# Patient Record
Sex: Female | Born: 1995 | Race: Black or African American | Hispanic: No | Marital: Single | Smoking: Former smoker
Health system: Southern US, Community
[De-identification: ages and names within clinical notes are randomized; demographics above are authoritative.]

## PROBLEM LIST (undated history)

## (undated) ENCOUNTER — Inpatient Hospital Stay (HOSPITAL_COMMUNITY): Payer: Self-pay

## (undated) DIAGNOSIS — T4145XA Adverse effect of unspecified anesthetic, initial encounter: Secondary | ICD-10-CM

## (undated) DIAGNOSIS — F319 Bipolar disorder, unspecified: Secondary | ICD-10-CM

## (undated) DIAGNOSIS — F4310 Post-traumatic stress disorder, unspecified: Secondary | ICD-10-CM

## (undated) DIAGNOSIS — T8859XA Other complications of anesthesia, initial encounter: Secondary | ICD-10-CM

## (undated) DIAGNOSIS — O1490 Unspecified pre-eclampsia, unspecified trimester: Secondary | ICD-10-CM

## (undated) DIAGNOSIS — F99 Mental disorder, not otherwise specified: Secondary | ICD-10-CM

## (undated) HISTORY — DX: Mental disorder, not otherwise specified: F99

## (undated) HISTORY — DX: Post-traumatic stress disorder, unspecified: F43.10

## (undated) HISTORY — DX: Bipolar disorder, unspecified: F31.9

## (undated) HISTORY — PX: WISDOM TOOTH EXTRACTION: SHX21

---
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
## 2017-06-25 ENCOUNTER — Encounter (HOSPITAL_COMMUNITY): Payer: Self-pay | Admitting: Emergency Medicine

## 2017-06-25 ENCOUNTER — Emergency Department (HOSPITAL_COMMUNITY)
Admission: EM | Admit: 2017-06-25 | Discharge: 2017-06-25 | Disposition: A | Discharge status: Home / Self Care | Payer: Medicaid Other | Attending: Emergency Medicine | Admitting: Emergency Medicine

## 2017-06-25 ENCOUNTER — Emergency Department (HOSPITAL_COMMUNITY): Payer: Medicaid Other

## 2017-06-25 ENCOUNTER — Other Ambulatory Visit: Payer: Self-pay

## 2017-06-25 DIAGNOSIS — Y9389 Activity, other specified: Secondary | ICD-10-CM | POA: Diagnosis not present

## 2017-06-25 DIAGNOSIS — Y999 Unspecified external cause status: Secondary | ICD-10-CM | POA: Diagnosis not present

## 2017-06-25 DIAGNOSIS — S20219A Contusion of unspecified front wall of thorax, initial encounter: Secondary | ICD-10-CM | POA: Diagnosis not present

## 2017-06-25 DIAGNOSIS — Z87891 Personal history of nicotine dependence: Secondary | ICD-10-CM | POA: Diagnosis not present

## 2017-06-25 DIAGNOSIS — Y929 Unspecified place or not applicable: Secondary | ICD-10-CM | POA: Insufficient documentation

## 2017-06-25 MED ORDER — CYCLOBENZAPRINE HCL 5 MG PO TABS: 5.0000 mg | ORAL_TABLET | Freq: Two times a day (BID) | ORAL | 0 refills | Status: DC | PRN

## 2017-06-25 MED ORDER — NAPROXEN 375 MG PO TABS: 375.0000 mg | ORAL_TABLET | Freq: Two times a day (BID) | ORAL | 0 refills | Status: DC
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
## 2017-06-25 NOTE — Discharge Instructions (Signed)
The muscle relaxer can make you sleepy so do not drive when taking it.
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
## 2017-10-19 ENCOUNTER — Encounter (HOSPITAL_COMMUNITY): Payer: Self-pay | Admitting: Emergency Medicine

## 2017-10-19 ENCOUNTER — Emergency Department (HOSPITAL_COMMUNITY)
Admission: EM | Admit: 2017-10-19 | Discharge: 2017-10-19 | Disposition: A | Discharge status: Home / Self Care | Payer: Medicaid Other | Attending: Emergency Medicine | Admitting: Emergency Medicine

## 2017-10-19 ENCOUNTER — Other Ambulatory Visit: Payer: Self-pay

## 2017-10-19 DIAGNOSIS — R112 Nausea with vomiting, unspecified: Secondary | ICD-10-CM | POA: Insufficient documentation

## 2017-10-19 DIAGNOSIS — Z3202 Encounter for pregnancy test, result negative: Secondary | ICD-10-CM | POA: Diagnosis not present

## 2017-10-19 DIAGNOSIS — Z87891 Personal history of nicotine dependence: Secondary | ICD-10-CM | POA: Diagnosis not present

## 2017-10-19 DIAGNOSIS — R1030 Lower abdominal pain, unspecified: Secondary | ICD-10-CM | POA: Diagnosis not present

## 2017-10-19 LAB — URINALYSIS, ROUTINE W REFLEX MICROSCOPIC
BILIRUBIN URINE: NEGATIVE
GLUCOSE, UA: NEGATIVE mg/dL
HGB URINE DIPSTICK: NEGATIVE
KETONES UR: 20 mg/dL — AB
Leukocytes, UA: NEGATIVE
Nitrite: NEGATIVE
Protein, ur: NEGATIVE mg/dL
Specific Gravity, Urine: 1.034 — ABNORMAL HIGH (ref 1.005–1.030)
pH: 5 (ref 5.0–8.0)

## 2017-10-19 LAB — I-STAT BETA HCG BLOOD, ED (MC, WL, AP ONLY): I-stat hCG, quantitative: <5 m[IU]/mL (ref <5)

## 2017-10-19 LAB — I-STAT CHEM 8, ED
BUN: 11 mg/dL (ref 6–20)
CALCIUM ION: 1.14 mmol/L — AB (ref 1.15–1.40)
CHLORIDE: 109 mmol/L (ref 101–111)
Creatinine, Ser: 0.7 mg/dL (ref 0.44–1.00)
Glucose, Bld: 98 mg/dL (ref 65–99)
HEMATOCRIT: 40 % (ref 36.0–46.0)
Hemoglobin: 13.6 g/dL (ref 12.0–15.0)
Potassium: 3.7 mmol/L (ref 3.5–5.1)
SODIUM: 140 mmol/L (ref 135–145)
TCO2: 20 mmol/L — AB (ref 22–32)

## 2017-10-19 MED ORDER — ONDANSETRON 8 MG PO TBDP: 8.0000 mg | ORAL_TABLET | Freq: Once | ORAL | Status: DC

## 2017-10-19 MED ORDER — ONDANSETRON 8 MG PO TBDP
8.0000 mg | ORAL_TABLET | Freq: Once | ORAL | Status: AC
  Administered 2017-10-19: 8 mg via ORAL
  Filled 2017-10-19: qty 1

## 2017-10-19 MED ORDER — SODIUM CHLORIDE 0.9 % IV BOLUS (SEPSIS): 1000.0000 mL | Freq: Once | INTRAVENOUS | Status: DC

## 2017-10-19 MED ORDER — ONDANSETRON HCL 4 MG/2ML IJ SOLN
4.0000 mg | Freq: Once | INTRAMUSCULAR | Status: DC
  Filled 2017-10-19: qty 2

## 2017-10-19 MED ORDER — PROMETHAZINE HCL 25 MG PO TABS: 25.0000 mg | ORAL_TABLET | Freq: Four times a day (QID) | ORAL | 0 refills | Status: DC | PRN

## 2017-10-19 NOTE — ED Provider Notes (Signed)
[facility]-EMERGENCY DEPT Provider Note   CSN: [id number] Arrival date & time: 10/19/17  0251     History   Chief Complaint Chief Complaint  Patient presents with  . Emesis    HPI Andrea Espinoza is a 22 y.o. female.  22 year old female presents to the emergency department for evaluation of vomiting.  She reports 2 episodes of emesis prior to arrival.  She has started to develop some lower abdominal cramping.  This is mild.  No medications taken prior to arrival.  No associated diarrhea.  She further denies dysuria, hematuria, fevers.  No history of abdominal surgeries.  She was around her daughter who had vomiting 2 days ago.  Denies questionable food ingestions; has had anorexia today.   The history is provided by the patient. No language interpreter was used.  Emesis      Past Medical History:  Diagnosis Date  . Medical history non-contributory     Patient Active Problem List   Diagnosis Date Noted  . Severe pre-eclampsia 05/11/2016    Past Surgical History:  Procedure Laterality Date  . NO PAST SURGERIES      OB History    Gravida Para Term Preterm AB Living   1 1   1   1    SAB TAB Ectopic Multiple Live Births         0 1       Home Medications    Prior to Admission medications   Medication Sig Start Date End Date Taking? Authorizing Provider  Burr MedicoXULANE 150-35 MCG/24HR transdermal patch Place 1 patch onto the skin once a week. 09/21/17  Yes [provider]  naproxen (NAPROSYN) 375 MG tablet Take 1 tablet (375 mg total) 2 (two) times daily by mouth. Patient not taking: Reported on 10/19/2017 06/25/17   Janne NapoleonNeese, Hope M, NP  promethazine (PHENERGAN) 25 MG tablet Take 1 tablet (25 mg total) by mouth every 6 (six) hours as needed for nausea or vomiting. 10/19/17   Antony MaduraHumes, [place], PA-C    Family History History reviewed. No pertinent family history.  Social History Social History   Tobacco Use  . Smoking status: Former Smoker    Types:  Cigarettes    Last attempt to quit: 09/11/2015    Years since quitting: 2.1  . Smokeless tobacco: Never Used  Substance Use Topics  . Alcohol use: No  . Drug use: No     Allergies   Patient has no known allergies.   Review of Systems Review of Systems  Gastrointestinal: Positive for vomiting.  Ten systems reviewed and are negative for acute change, except as noted in the HPI.    Physical Exam Updated Vital Signs BP 109/69 (BP Location: Left Arm)   Pulse 95   Temp 99 F (37.2 C) (Oral)   Resp 18   Ht 5\' 7"  (1.702 m)   Wt 74.8 kg (165 lb)   LMP 09/29/2017   SpO2 100%   BMI 25.84 kg/m   Physical Exam  Constitutional: She is oriented to person, place, and time. She appears well-developed and well-nourished. No distress.  Nontoxic appearing and in NAD  HENT:  Head: Normocephalic and atraumatic.  Eyes: Conjunctivae and EOM are normal. No scleral icterus.  Neck: Normal range of motion.  Cardiovascular: Normal rate, regular rhythm and intact distal pulses.  Pulmonary/Chest: Effort normal. No stridor. No respiratory distress. She has no wheezes.  Lungs CTAB  Abdominal: Soft. She exhibits no mass. There is tenderness (minimal, suprapubic). There is  no guarding.  Abdomen soft, nondistended  Musculoskeletal: Normal range of motion.  Neurological: She is alert and oriented to person, place, and time. She exhibits normal muscle tone. Coordination normal.  Skin: Skin is warm and dry. No rash noted. She is not diaphoretic. No erythema. No pallor.  Psychiatric: She has a normal mood and affect. Her behavior is normal.  Nursing note and vitals reviewed.    ED Treatments / Results  Labs (all labs ordered are listed, but only abnormal results are displayed) Labs Reviewed  URINALYSIS, ROUTINE W REFLEX MICROSCOPIC - Abnormal; Notable for the following components:      Result Value   Specific Gravity, Urine 1.034 (*)    Ketones, ur 20 (*)    All other components within normal  limits  I-STAT CHEM 8, ED - Abnormal; Notable for the following components:   Calcium, Ion 1.14 (*)    TCO2 20 (*)    All other components within normal limits  I-STAT BETA HCG BLOOD, ED ([facility], [facility], [facility] ONLY)    EKG  EKG Interpretation None       Radiology No results found.  Procedures Procedures (including critical care time)  Medications Ordered in ED Medications  ondansetron (ZOFRAN-ODT) disintegrating tablet 8 mg (8 mg Oral Given 10/19/17 0358)     Initial Impression / Assessment and Plan / ED Course  I have reviewed the triage vital signs and the nursing notes.  Pertinent labs & imaging results that were available during my care of the patient were reviewed by me and considered in my medical decision making (see chart for details).     22 year old female presents to the emergency department for evaluation of vomiting x2 this evening.  Mild suprapubic cramping and tenderness on exam.  Urinalysis does not suggest infection.  Chemistry panel reassuring.  Pregnancy negative.  Symptoms have been managed adequately in the emergency department with Zofran.  The patient is able to tolerate oral fluids without additional vomiting.  Suspect viral illness as patient was around her daughter who had vomiting 2 days ago.  No signs of acute surgical abdomen on exam.  No TTP at McBurney's point.  I do not believe emergent imaging is currently indicated.  Return precautions discussed and provided. Patient discharged in stable condition with no unaddressed concerns.   Final Clinical Impressions(s) / ED Diagnoses   Final diagnoses:  Non-intractable vomiting with nausea, unspecified vomiting type    ED Discharge Orders        Ordered    promethazine (PHENERGAN) 25 MG tablet  Every 6 hours PRN     10/19/17 0534       Antony Madura, PA-C 10/19/17 0541    Gerhard Munch, MD 10/19/17 [phone]

## 2017-10-19 NOTE — ED Triage Notes (Signed)
Pt reports having 2 episodes of emesis since last night.

## 2017-10-19 NOTE — ED Notes (Signed)
Pt. Tolerated PO fluid intake ,denied N/V.

## 2018-06-24 LAB — CHG URINE PREGNANCY TEST: URINE CULTURE, OB: POSITIVE

## 2018-07-08 ENCOUNTER — Encounter: Payer: Self-pay | Admitting: *Deleted

## 2018-08-03 ENCOUNTER — Other Ambulatory Visit (HOSPITAL_COMMUNITY): Payer: Self-pay | Admitting: Nurse Practitioner

## 2018-08-03 DIAGNOSIS — Z369 Encounter for antenatal screening, unspecified: Secondary | ICD-10-CM

## 2018-08-03 LAB — OB RESULTS CONSOLE GC/CHLAMYDIA
Chlamydia: NEGATIVE
Gonorrhea: NEGATIVE

## 2018-08-03 LAB — OB RESULTS CONSOLE HGB/HCT, BLOOD
HCT: 35 (ref 29–41)
Hemoglobin: 11.5

## 2018-08-03 LAB — OB RESULTS CONSOLE ANTIBODY SCREEN: ANTIBODY SCREEN: NEGATIVE

## 2018-08-03 LAB — OB RESULTS CONSOLE HEPATITIS B SURFACE ANTIGEN: Hepatitis B Surface Ag: NEGATIVE

## 2018-08-03 LAB — CYSTIC FIBROSIS DIAGNOSTIC STUDY: INTERPRETATION-CFDNA: NEGATIVE

## 2018-08-03 LAB — OB RESULTS CONSOLE ABO/RH: RH TYPE: POSITIVE

## 2018-08-03 LAB — OB RESULTS CONSOLE VARICELLA ZOSTER ANTIBODY, IGG: Varicella: NON-IMMUNE/NOT IMMUNE

## 2018-08-03 LAB — OB RESULTS CONSOLE HIV ANTIBODY (ROUTINE TESTING): HIV: NONREACTIVE

## 2018-08-03 LAB — OB RESULTS CONSOLE RPR: RPR: NONREACTIVE

## 2018-08-03 LAB — OB RESULTS CONSOLE PLATELET COUNT: Platelets: 251

## 2018-08-03 LAB — CULTURE, OB URINE: Urine Culture, OB: NEGATIVE

## 2018-08-04 ENCOUNTER — Other Ambulatory Visit: Payer: Self-pay | Admitting: Nurse Practitioner

## 2018-08-04 DIAGNOSIS — N6310 Unspecified lump in the right breast, unspecified quadrant: Secondary | ICD-10-CM

## 2018-08-17 ENCOUNTER — Encounter: Payer: Medicaid Other | Admitting: Obstetrics and Gynecology

## 2018-08-18 ENCOUNTER — Encounter: Payer: Self-pay | Admitting: General Practice

## 2018-08-18 ENCOUNTER — Telehealth: Payer: Self-pay | Admitting: Family Medicine

## 2018-08-18 ENCOUNTER — Encounter (HOSPITAL_COMMUNITY): Payer: Self-pay

## 2018-08-18 DIAGNOSIS — O09299 Supervision of pregnancy with other poor reproductive or obstetric history, unspecified trimester: Secondary | ICD-10-CM | POA: Insufficient documentation

## 2018-08-18 DIAGNOSIS — Z8759 Personal history of other complications of pregnancy, childbirth and the puerperium: Secondary | ICD-10-CM | POA: Insufficient documentation

## 2018-08-18 DIAGNOSIS — O0990 Supervision of high risk pregnancy, unspecified, unspecified trimester: Secondary | ICD-10-CM | POA: Insufficient documentation

## 2018-08-18 NOTE — Telephone Encounter (Signed)
Call attempt made, left a voicemail informing the patient to call our clinic. Sending a reminder letter with the updated information.

## 2018-08-19 NOTE — L&D Delivery Note (Addendum)
Delivery Note At 10:36 AM a viable female was delivered via Vaginal on hands and kness, Spontaneous (Presentation: Vertex; OA).  APGAR: 9, 9; weight 7 lb 8.8 oz (3425 g).   Placenta status: Spontaneous, Intact.  Cord: 3 vessels with the following complications: none.   Anesthesia:  None Episiotomy: None Lacerations: 1st degree;Perineal Suture Repair: vicryl rapide 3-0 Est. Blood Loss (mL): 168cc per Triton  Mom to postpartum.  Baby to Couplet care / Skin to Skin.  Gerlene Fee, DO Family Medicine Resident, PGY-1  Attestation: I agree with the resident's documentation. I was present for the entire delivery and assisted with the repair. Some elevated BP just prior and immediately postpartum. CBC and CMP collected and wnl. BP improved to normal range prior to transfer to postpartum unit.   Lambert Mody. Juleen China, DO OB/GYN Fellow

## 2018-08-20 ENCOUNTER — Encounter: Payer: Self-pay | Admitting: *Deleted

## 2018-08-21 ENCOUNTER — Other Ambulatory Visit: Payer: Medicaid Other

## 2018-08-25 ENCOUNTER — Encounter (HOSPITAL_COMMUNITY): Payer: Self-pay

## 2018-08-25 ENCOUNTER — Ambulatory Visit (HOSPITAL_COMMUNITY)
Admission: RE | Admit: 2018-08-25 | Discharge: 2018-08-25 | Disposition: A | Discharge status: Home / Self Care | Payer: Medicaid Other | Source: Ambulatory Visit | Attending: Nurse Practitioner | Admitting: Nurse Practitioner

## 2018-08-25 ENCOUNTER — Other Ambulatory Visit (HOSPITAL_COMMUNITY): Payer: Self-pay | Admitting: Nurse Practitioner

## 2018-08-25 ENCOUNTER — Ambulatory Visit (HOSPITAL_COMMUNITY): Admission: RE | Admit: 2018-08-25 | Payer: Medicaid Other | Source: Ambulatory Visit

## 2018-08-25 DIAGNOSIS — Z369 Encounter for antenatal screening, unspecified: Secondary | ICD-10-CM | POA: Diagnosis present

## 2018-08-25 DIAGNOSIS — Z8759 Personal history of other complications of pregnancy, childbirth and the puerperium: Secondary | ICD-10-CM | POA: Diagnosis present

## 2018-08-25 DIAGNOSIS — O0990 Supervision of high risk pregnancy, unspecified, unspecified trimester: Secondary | ICD-10-CM | POA: Insufficient documentation

## 2018-08-25 DIAGNOSIS — O09299 Supervision of pregnancy with other poor reproductive or obstetric history, unspecified trimester: Secondary | ICD-10-CM

## 2018-08-25 DIAGNOSIS — Z3687 Encounter for antenatal screening for uncertain dates: Secondary | ICD-10-CM

## 2018-08-25 DIAGNOSIS — Z3682 Encounter for antenatal screening for nuchal translucency: Secondary | ICD-10-CM | POA: Insufficient documentation

## 2018-08-25 DIAGNOSIS — Z3A13 13 weeks gestation of pregnancy: Secondary | ICD-10-CM

## 2018-08-25 DIAGNOSIS — O09213 Supervision of pregnancy with history of pre-term labor, third trimester: Secondary | ICD-10-CM

## 2018-08-25 HISTORY — DX: Adverse effect of unspecified anesthetic, initial encounter: T41.45XA

## 2018-08-25 HISTORY — DX: Other complications of anesthesia, initial encounter: T88.59XA

## 2018-08-25 HISTORY — DX: Unspecified pre-eclampsia, unspecified trimester: O14.90

## 2018-08-26 ENCOUNTER — Ambulatory Visit (HOSPITAL_COMMUNITY)
Admission: RE | Admit: 2018-08-26 | Discharge: 2018-08-26 | Disposition: A | Discharge status: Home / Self Care | Payer: Medicaid Other | Source: Ambulatory Visit | Attending: Nurse Practitioner | Admitting: Nurse Practitioner

## 2018-08-26 ENCOUNTER — Ambulatory Visit (HOSPITAL_COMMUNITY): Payer: Self-pay | Admitting: Nurse Practitioner

## 2018-08-26 ENCOUNTER — Other Ambulatory Visit (HOSPITAL_COMMUNITY): Payer: Self-pay | Admitting: Nurse Practitioner

## 2018-08-26 DIAGNOSIS — Z3682 Encounter for antenatal screening for nuchal translucency: Secondary | ICD-10-CM | POA: Diagnosis not present

## 2018-08-26 NOTE — ED Notes (Signed)
Patient in session with Genetic Counselor, Lauren Taylor. 

## 2018-08-26 NOTE — ED Notes (Signed)
Alpha Thal, Inheritest Core, Mat 21 drawn today.

## 2018-08-27 ENCOUNTER — Encounter: Payer: Self-pay | Admitting: Obstetrics and Gynecology

## 2018-08-27 ENCOUNTER — Ambulatory Visit (INDEPENDENT_AMBULATORY_CARE_PROVIDER_SITE_OTHER): Payer: Self-pay | Admitting: Obstetrics and Gynecology

## 2018-08-27 VITALS — BP 105/70 | HR 73 | Wt 176.6 lb

## 2018-08-27 DIAGNOSIS — O0992 Supervision of high risk pregnancy, unspecified, second trimester: Secondary | ICD-10-CM

## 2018-08-27 DIAGNOSIS — O0990 Supervision of high risk pregnancy, unspecified, unspecified trimester: Secondary | ICD-10-CM

## 2018-08-27 DIAGNOSIS — Z8759 Personal history of other complications of pregnancy, childbirth and the puerperium: Secondary | ICD-10-CM

## 2018-08-27 MED ORDER — ASPIRIN EC 81 MG PO TBEC: 81.0000 mg | DELAYED_RELEASE_TABLET | Freq: Every day | ORAL | 2 refills | Status: DC

## 2018-08-27 NOTE — Patient Instructions (Addendum)
Second Trimester of Pregnancy The second trimester is from week 14 through week 27 (months 4 through 6). The second trimester is often a time when you feel your best. Your body has adjusted to being pregnant, and you begin to feel better physically. Usually, morning sickness has lessened or quit completely, you may have more energy, and you may have an increase in appetite. The second trimester is also a time when the fetus is growing rapidly. At the end of the sixth month, the fetus is about 9 inches long and weighs about 1 pounds. You will likely begin to feel the baby move (quickening) between 16 and 20 weeks of pregnancy. Body changes during your second trimester Your body continues to go through many changes during your second trimester. The changes vary from woman to woman.  Your weight will continue to increase. You will notice your lower abdomen bulging out.  You may begin to get stretch marks on your hips, abdomen, and breasts.  You may develop headaches that can be relieved by medicines. The medicines should be approved by your health care provider.  You may urinate more often because the fetus is pressing on your bladder.  You may develop or continue to have heartburn as a result of your pregnancy.  You may develop constipation because certain hormones are causing the muscles that push waste through your intestines to slow down.  You may develop hemorrhoids or swollen, bulging veins (varicose veins).  You may have back pain. This is caused by: ? Weight gain. ? Pregnancy hormones that are relaxing the joints in your pelvis. ? A shift in weight and the muscles that support your balance.  Your breasts will continue to grow and they will continue to become tender.  Your gums may bleed and may be sensitive to brushing and flossing.  Dark spots or blotches (chloasma, mask of pregnancy) may develop on your face. This will likely fade after the baby is born.  A dark line from  your belly button to the pubic area (linea nigra) may appear. This will likely fade after the baby is born.  You may have changes in your hair. These can include thickening of your hair, rapid growth, and changes in texture. Some women also have hair loss during or after pregnancy, or hair that feels dry or thin. Your hair will most likely return to normal after your baby is born. What to expect at prenatal visits During a routine prenatal visit:  You will be weighed to make sure you and the fetus are growing normally.  Your blood pressure will be taken.  Your abdomen will be measured to track your baby's growth.  The fetal heartbeat will be listened to.  Any test results from the previous visit will be discussed. Your health care provider may ask you:  How you are feeling.  If you are feeling the baby move.  If you have had any abnormal symptoms, such as leaking fluid, bleeding, severe headaches, or abdominal cramping.  If you are using any tobacco products, including cigarettes, chewing tobacco, and electronic cigarettes.  If you have any questions. Other tests that may be performed during your second trimester include:  Blood tests that check for: ? Low iron levels (anemia). ? High blood sugar that affects pregnant women (gestational diabetes) between 47 and 28 weeks. ? Rh antibodies. This is to check for a protein on red blood cells (Rh factor).  Urine tests to check for infections, diabetes, or protein in  the urine.  An ultrasound to confirm the proper growth and development of the baby.  An amniocentesis to check for possible genetic problems.  Fetal screens for spina bifida and Down syndrome.  HIV (human immunodeficiency virus) testing. Routine prenatal testing includes screening for HIV, unless you choose not to have this test. Follow these instructions at home: Medicines  Follow your health care provider's instructions regarding medicine use. Specific medicines  may be either safe or unsafe to take during pregnancy.  Take a prenatal vitamin that contains at least 600 micrograms (mcg) of folic acid.  If you develop constipation, try taking a stool softener if your health care provider approves. Eating and drinking   Eat a balanced diet that includes fresh fruits and vegetables, whole grains, good sources of protein such as meat, eggs, or tofu, and low-fat dairy. Your health care provider will help you determine the amount of weight gain that is right for you.  Avoid raw meat and uncooked cheese. These carry germs that can cause birth defects in the baby.  If you have low calcium intake from food, talk to your health care provider about whether you should take a daily calcium supplement.  Limit foods that are high in fat and processed sugars, such as fried and sweet foods.  To prevent constipation: ? Drink enough fluid to keep your urine clear or pale yellow. ? Eat foods that are high in fiber, such as fresh fruits and vegetables, whole grains, and beans. Activity  Exercise only as directed by your health care provider. Most women can continue their usual exercise routine during pregnancy. Try to exercise for 30 minutes at least 5 days a week. Stop exercising if you experience uterine contractions.  Avoid heavy lifting, wear low heel shoes, and practice good posture.  A sexual relationship may be continued unless your health care provider directs you otherwise. Relieving pain and discomfort  Wear a good support bra to prevent discomfort from breast tenderness.  Take warm sitz baths to soothe any pain or discomfort caused by hemorrhoids. Use hemorrhoid cream if your health care provider approves.  Rest with your legs elevated if you have leg cramps or low back pain.  If you develop varicose veins, wear support hose. Elevate your feet for 15 minutes, 3-4 times a day. Limit salt in your diet. Prenatal Care  Write down your questions. Take  them to your prenatal visits.  Keep all your prenatal visits as told by your health care provider. This is important. Safety  Wear your seat belt at all times when driving.  Make a list of emergency phone numbers, including numbers for family, friends, the hospital, and police and fire departments. General instructions  Ask your health care provider for a referral to a local prenatal education class. Begin classes no later than the beginning of month 6 of your pregnancy.  Ask for help if you have counseling or nutritional needs during pregnancy. Your health care provider can offer advice or refer you to specialists for help with various needs.  Do not use hot tubs, steam rooms, or saunas.  Do not douche or use tampons or scented sanitary pads.  Do not cross your legs for long periods of time.  Avoid cat litter boxes and soil used by cats. These carry germs that can cause birth defects in the baby and possibly loss of the fetus by miscarriage or stillbirth.  Avoid all smoking, herbs, alcohol, and unprescribed drugs. Chemicals in these products can affect the  formation and growth of the baby.  Do not use any products that contain nicotine or tobacco, such as cigarettes and e-cigarettes. If you need help quitting, ask your health care provider.  Visit your dentist if you have not gone yet during your pregnancy. Use a soft toothbrush to brush your teeth and be gentle when you floss. Contact a health care provider if:  You have dizziness.  You have mild pelvic cramps, pelvic pressure, or nagging pain in the abdominal area.  You have persistent nausea, vomiting, or diarrhea.  You have a bad smelling vaginal discharge.  You have pain when you urinate. Get help right away if:  You have a fever.  You are leaking fluid from your vagina.  You have spotting or bleeding from your vagina.  You have severe abdominal cramping or pain.  You have rapid weight gain or weight loss.  You  have shortness of breath with chest pain.  You notice sudden or extreme swelling of your face, hands, ankles, feet, or legs.  You have not felt your baby move in over an hour.  You have severe headaches that do not go away when you take medicine.  You have vision changes. Summary  The second trimester is from week 14 through week 27 (months 4 through 6). It is also a time when the fetus is growing rapidly.  Your body goes through many changes during pregnancy. The changes vary from woman to woman.  Avoid all smoking, herbs, alcohol, and unprescribed drugs. These chemicals affect the formation and growth your baby.  Do not use any tobacco products, such as cigarettes, chewing tobacco, and e-cigarettes. If you need help quitting, ask your health care provider.  Contact your health care provider if you have any questions. Keep all prenatal visits as told by your health care provider. This is important. This information is not intended to replace advice given to you by your health care provider. Make sure you discuss any questions you have with your health care provider. Document Released: 07/30/2001 Document Revised: 09/10/2016 Document Reviewed: 09/10/2016 Elsevier Interactive Patient Education  2019 [employer].   Contraception Choices Contraception, also called birth control, refers to methods or devices that prevent pregnancy. Hormonal methods Contraceptive implant  A contraceptive implant is a thin, plastic tube that contains a hormone. It is inserted into the upper part of the arm. It can remain in place for up to 3 years. Progestin-only injections Progestin-only injections are injections of progestin, a synthetic form of the hormone progesterone. They are given every 3 months by a health care provider. Birth control pills  Birth control pills are pills that contain hormones that prevent pregnancy. They must be taken once a day, preferably at the same time each day. Birth  control patch  The birth control patch contains hormones that prevent pregnancy. It is placed on the skin and must be changed once a week for three weeks and removed on the fourth week. A prescription is needed to use this method of contraception. Vaginal ring  A vaginal ring contains hormones that prevent pregnancy. It is placed in the vagina for three weeks and removed on the fourth week. After that, the process is repeated with a new ring. A prescription is needed to use this method of contraception. Emergency contraceptive Emergency contraceptives prevent pregnancy after unprotected sex. They come in pill form and can be taken up to 5 days after sex. They work best the sooner they are taken after having sex. Most  emergency contraceptives are available without a prescription. This method should not be used as your only form of birth control. Barrier methods Female condom  A female condom is a thin sheath that is worn over the penis during sex. Condoms keep sperm from going inside a woman's body. They can be used with a spermicide to increase their effectiveness. They should be disposed after a single use. Female condom  A female condom is a soft, loose-fitting sheath that is put into the vagina before sex. The condom keeps sperm from going inside a woman's body. They should be disposed after a single use. Diaphragm  A diaphragm is a soft, dome-shaped barrier. It is inserted into the vagina before sex, along with a spermicide. The diaphragm blocks sperm from entering the uterus, and the spermicide kills sperm. A diaphragm should be left in the vagina for 6-8 hours after sex and removed within 24 hours. A diaphragm is prescribed and fitted by a health care provider. A diaphragm should be replaced every 1-2 years, after giving birth, after gaining more than 15 lb (6.8 kg), and after pelvic surgery. Cervical cap  A cervical cap is a round, soft latex or plastic cup that fits over the cervix. It is  inserted into the vagina before sex, along with spermicide. It blocks sperm from entering the uterus. The cap should be left in place for 6-8 hours after sex and removed within 48 hours. A cervical cap must be prescribed and fitted by a health care provider. It should be replaced every 2 years. Sponge  A sponge is a soft, circular piece of polyurethane foam with spermicide on it. The sponge helps block sperm from entering the uterus, and the spermicide kills sperm. To use it, you make it wet and then insert it into the vagina. It should be inserted before sex, left in for at least 6 hours after sex, and removed and thrown away within 30 hours. Spermicides Spermicides are chemicals that kill or block sperm from entering the cervix and uterus. They can come as a cream, jelly, suppository, foam, or tablet. A spermicide should be inserted into the vagina with an applicator at least 93-26 minutes before sex to allow time for it to work. The process must be repeated every time you have sex. Spermicides do not require a prescription. Intrauterine contraception Intrauterine device (IUD) An IUD is a T-shaped device that is put in a woman's uterus. There are two types:  Hormone IUD.This type contains progestin, a synthetic form of the hormone progesterone. This type can stay in place for 3-5 years.  Copper IUD.This type is wrapped in copper wire. It can stay in place for 10 years.  Permanent methods of contraception Female tubal ligation In this method, a woman's fallopian tubes are sealed, tied, or blocked during surgery to prevent eggs from traveling to the uterus. Hysteroscopic sterilization In this method, a small, flexible insert is placed into each fallopian tube. The inserts cause scar tissue to form in the fallopian tubes and block them, so sperm cannot reach an egg. The procedure takes about 3 months to be effective. Another form of birth control must be used during those 3 months. Female  sterilization This is a procedure to tie off the tubes that carry sperm (vasectomy). After the procedure, the man can still ejaculate fluid (semen). Natural planning methods Natural family planning In this method, a couple does not have sex on days when the woman could become pregnant. Calendar method This means keeping  track of the length of each menstrual cycle, identifying the days when pregnancy can happen, and not having sex on those days. Ovulation method In this method, a couple avoids sex during ovulation. Symptothermal method This method involves not having sex during ovulation. The woman typically checks for ovulation by watching changes in her temperature and in the consistency of cervical mucus. Post-ovulation method In this method, a couple waits to have sex until after ovulation. Summary  Contraception, also called birth control, means methods or devices that prevent pregnancy.  Hormonal methods of contraception include implants, injections, pills, patches, vaginal rings, and emergency contraceptives.  Barrier methods of contraception can include female condoms, female condoms, diaphragms, cervical caps, sponges, and spermicides.  There are two types of IUDs (intrauterine devices). An IUD can be put in a woman's uterus to prevent pregnancy for 3-5 years.  Permanent sterilization can be done through a procedure for males, females, or both.  Natural family planning methods involve not having sex on days when the woman could become pregnant. This information is not intended to replace advice given to you by your health care provider. Make sure you discuss any questions you have with your health care provider. Document Released: 08/05/2005 Document Revised: 08/07/2017 Document Reviewed: 09/07/2016 Elsevier Interactive Patient Education  2019 [employer].   Breastfeeding  Choosing to breastfeed is one of the best decisions you can make for yourself and your baby. A change in  hormones during pregnancy causes your breasts to make breast milk in your milk-producing glands. Hormones prevent breast milk from being released before your baby is born. They also prompt milk flow after birth. Once breastfeeding has begun, thoughts of your baby, as well as his or her sucking or crying, can stimulate the release of milk from your milk-producing glands. Benefits of breastfeeding Research shows that breastfeeding offers many health benefits for infants and mothers. It also offers a cost-free and convenient way to feed your baby. For your baby  Your first milk (colostrum) helps your baby's digestive system to function better.  Special cells in your milk (antibodies) help your baby to fight off infections.  Breastfed babies are less likely to develop asthma, allergies, obesity, or type 2 diabetes. They are also at lower risk for sudden infant death syndrome (SIDS).  Nutrients in breast milk are better able to meet your baby's needs compared to infant formula.  Breast milk improves your baby's brain development. For you  Breastfeeding helps to create a very special bond between you and your baby.  Breastfeeding is convenient. Breast milk costs nothing and is always available at the correct temperature.  Breastfeeding helps to burn calories. It helps you to lose the weight that you gained during pregnancy.  Breastfeeding makes your uterus return faster to its size before pregnancy. It also slows bleeding (lochia) after you give birth.  Breastfeeding helps to lower your risk of developing type 2 diabetes, osteoporosis, rheumatoid arthritis, cardiovascular disease, and breast, ovarian, uterine, and endometrial cancer later in life. Breastfeeding basics Starting breastfeeding  Find a comfortable place to sit or lie down, with your neck and back well-supported.  Place a pillow or a rolled-up blanket under your baby to bring him or her to the level of your breast (if you are  seated). Nursing pillows are specially designed to help support your arms and your baby while you breastfeed.  Make sure that your baby's tummy (abdomen) is facing your abdomen.  Gently massage your breast. With your fingertips, massage from  the outer edges of your breast inward toward the nipple. This encourages milk flow. If your milk flows slowly, you may need to continue this action during the feeding.  Support your breast with 4 fingers underneath and your thumb above your nipple (make the letter "C" with your hand). Make sure your fingers are well away from your nipple and your baby's mouth.  Stroke your baby's lips gently with your finger or nipple.  When your baby's mouth is open wide enough, quickly bring your baby to your breast, placing your entire nipple and as much of the areola as possible into your baby's mouth. The areola is the colored area around your nipple. ? More areola should be visible above your baby's upper lip than below the lower lip. ? Your baby's lips should be opened and extended outward (flanged) to ensure an adequate, comfortable latch. ? Your baby's tongue should be between his or her lower gum and your breast.  Make sure that your baby's mouth is correctly positioned around your nipple (latched). Your baby's lips should create a seal on your breast and be turned out (everted).  It is common for your baby to suck about 2-3 minutes in order to start the flow of breast milk. Latching Teaching your baby how to latch onto your breast properly is very important. An improper latch can cause nipple pain, decreased milk supply, and poor weight gain in your baby. Also, if your baby is not latched onto your nipple properly, he or she may swallow some air during feeding. This can make your baby fussy. Burping your baby when you switch breasts during the feeding can help to get rid of the air. However, teaching your baby to latch on properly is still the best way to prevent  fussiness from swallowing air while breastfeeding. Signs that your baby has successfully latched onto your nipple  Silent tugging or silent sucking, without causing you pain. Infant's lips should be extended outward (flanged).  Swallowing heard between every 3-4 sucks once your milk has started to flow (after your let-down milk reflex occurs).  Muscle movement above and in front of his or her ears while sucking. Signs that your baby has not successfully latched onto your nipple  Sucking sounds or smacking sounds from your baby while breastfeeding.  Nipple pain. If you think your baby has not latched on correctly, slip your finger into the corner of your baby's mouth to break the suction and place it between your baby's gums. Attempt to start breastfeeding again. Signs of successful breastfeeding Signs from your baby  Your baby will gradually decrease the number of sucks or will completely stop sucking.  Your baby will fall asleep.  Your baby's body will relax.  Your baby will retain a small amount of milk in his or her mouth.  Your baby will let go of your breast by himself or herself. Signs from you  Breasts that have increased in firmness, weight, and size 1-3 hours after feeding.  Breasts that are softer immediately after breastfeeding.  Increased milk volume, as well as a change in milk consistency and color by the fifth day of breastfeeding.  Nipples that are not sore, cracked, or bleeding. Signs that your baby is getting enough milk  Wetting at least 1-2 diapers during the first 24 hours after birth.  Wetting at least 5-6 diapers every 24 hours for the first week after birth. The urine should be clear or pale yellow by the age of 5 days.  Wetting 6-8 diapers every 24 hours as your baby continues to grow and develop.  At least 3 stools in a 24-hour period by the age of 5 days. The stool should be soft and yellow.  At least 3 stools in a 24-hour period by the age of 7  days. The stool should be seedy and yellow.  No loss of weight greater than 10% of birth weight during the first 3 days of life.  Average weight gain of 4-7 oz (113-198 g) per week after the age of 4 days.  Consistent daily weight gain by the age of 5 days, without weight loss after the age of 2 weeks. After a feeding, your baby may spit up a small amount of milk. This is normal. Breastfeeding frequency and duration Frequent feeding will help you make more milk and can prevent sore nipples and extremely full breasts (breast engorgement). Breastfeed when you feel the need to reduce the fullness of your breasts or when your baby shows signs of hunger. This is called "breastfeeding on demand." Signs that your baby is hungry include:  Increased alertness, activity, or restlessness.  Movement of the head from side to side.  Opening of the mouth when the corner of the mouth or cheek is stroked (rooting).  Increased sucking sounds, smacking lips, cooing, sighing, or squeaking.  Hand-to-mouth movements and sucking on fingers or hands.  Fussing or crying. Avoid introducing a pacifier to your baby in the first 4-6 weeks after your baby is born. After this time, you may choose to use a pacifier. Research has shown that pacifier use during the first year of a baby's life decreases the risk of sudden infant death syndrome (SIDS). Allow your baby to feed on each breast as long as he or she wants. When your baby unlatches or falls asleep while feeding from the first breast, offer the second breast. Because newborns are often sleepy in the first few weeks of life, you may need to awaken your baby to get him or her to feed. Breastfeeding times will vary from baby to baby. However, the following rules can serve as a guide to help you make sure that your baby is properly fed:  Newborns (babies 49 weeks of age or younger) may breastfeed every 1-3 hours.  Newborns should not go without breastfeeding for longer  than 3 hours during the day or 5 hours during the night.  You should breastfeed your baby a minimum of 8 times in a 24-hour period. Breast milk pumping     Pumping and storing breast milk allows you to make sure that your baby is exclusively fed your breast milk, even at times when you are unable to breastfeed. This is especially important if you go back to work while you are still breastfeeding, or if you are not able to be present during feedings. Your lactation consultant can help you find a method of pumping that works best for you and give you guidelines about how long it is safe to store breast milk. Caring for your breasts while you breastfeed Nipples can become dry, cracked, and sore while breastfeeding. The following recommendations can help keep your breasts moisturized and healthy:  Avoid using soap on your nipples.  Wear a supportive bra designed especially for nursing. Avoid wearing underwire-style bras or extremely tight bras (sports bras).  Air-dry your nipples for 3-4 minutes after each feeding.  Use only cotton bra pads to absorb leaked breast milk. Leaking of breast milk between feedings is  normal.  Use lanolin on your nipples after breastfeeding. Lanolin helps to maintain your skin's normal moisture barrier. Pure lanolin is not harmful (not toxic) to your baby. You may also hand express a few drops of breast milk and gently massage that milk into your nipples and allow the milk to air-dry. In the first few weeks after giving birth, some women experience breast engorgement. Engorgement can make your breasts feel heavy, warm, and tender to the touch. Engorgement peaks within 3-5 days after you give birth. The following recommendations can help to ease engorgement:  Completely empty your breasts while breastfeeding or pumping. You may want to start by applying warm, moist heat (in the shower or with warm, water-soaked hand towels) just before feeding or pumping. This increases  circulation and helps the milk flow. If your baby does not completely empty your breasts while breastfeeding, pump any extra milk after he or she is finished.  Apply ice packs to your breasts immediately after breastfeeding or pumping, unless this is too uncomfortable for you. To do this: ? Put ice in a plastic bag. ? Place a towel between your skin and the bag. ? Leave the ice on for 20 minutes, 2-3 times a day.  Make sure that your baby is latched on and positioned properly while breastfeeding. If engorgement persists after 48 hours of following these recommendations, contact your health care provider or a Science writer. Overall health care recommendations while breastfeeding  Eat 3 healthy meals and 3 snacks every day. Well-nourished mothers who are breastfeeding need an additional 450-500 calories a day. You can meet this requirement by increasing the amount of a balanced diet that you eat.  Drink enough water to keep your urine pale yellow or clear.  Rest often, relax, and continue to take your prenatal vitamins to prevent fatigue, stress, and low vitamin and mineral levels in your body (nutrient deficiencies).  Do not use any products that contain nicotine or tobacco, such as cigarettes and e-cigarettes. Your baby may be harmed by chemicals from cigarettes that pass into breast milk and exposure to secondhand smoke. If you need help quitting, ask your health care provider.  Avoid alcohol.  Do not use illegal drugs or marijuana.  Talk with your health care provider before taking any medicines. These include over-the-counter and prescription medicines as well as vitamins and herbal supplements. Some medicines that may be harmful to your baby can pass through breast milk.  It is possible to become pregnant while breastfeeding. If birth control is desired, ask your health care provider about options that will be safe while breastfeeding your baby. Where to find more  information: [employer]: www.llli.org Contact a health care provider if:  You feel like you want to stop breastfeeding or have become frustrated with breastfeeding.  Your nipples are cracked or bleeding.  Your breasts are red, tender, or warm.  You have: ? Painful breasts or nipples. ? A swollen area on either breast. ? A fever or chills. ? Nausea or vomiting. ? Drainage other than breast milk from your nipples.  Your breasts do not become full before feedings by the fifth day after you give birth.  You feel sad and depressed.  Your baby is: ? Too sleepy to eat well. ? Having trouble sleeping. ? More than 16 week old and wetting fewer than 6 diapers in a 24-hour period. ? Not gaining weight by 87 days of age.  Your baby has fewer than 3 stools in a  24-hour period.  Your baby's skin or the white parts of his or her eyes become yellow. Get help right away if:  Your baby is overly tired (lethargic) and does not want to wake up and feed.  Your baby develops an unexplained fever. Summary  Breastfeeding offers many health benefits for infant and mothers.  Try to breastfeed your infant when he or she shows early signs of hunger.  Gently tickle or stroke your baby's lips with your finger or nipple to allow the baby to open his or her mouth. Bring the baby to your breast. Make sure that much of the areola is in your baby's mouth. Offer one side and burp the baby before you offer the other side.  Talk with your health care provider or lactation consultant if you have questions or you face problems as you breastfeed. This information is not intended to replace advice given to you by your health care provider. Make sure you discuss any questions you have with your health care provider. Document Released: 08/05/2005 Document Revised: 09/06/2016 Document Reviewed: 09/06/2016 Elsevier Interactive Patient Education  2019 [employer] Education  Options: [facility] Department Classes:  Childbirth education classes can help you get ready for a positive parenting experience. You can also meet other expectant parents and get free stuff for your baby. Each class runs for five weeks on the same night and costs $45 for the mother-to-be and her support person. Medicaid covers the cost if you are eligible. Call [phone] to register. [facility] Childbirth Education:  [phone] or [phone] or [email or web address]  Baby & Me Class: Discuss newborn & infant parenting and family adjustment issues with other new mothers in a relaxed environment. Each week brings a new speaker or baby-centered activity. We encourage new mothers to join [place] every Thursday at 11:00am. Babies birth until crawling. No registration or fee. Daddy [employer]: This course offers Dads-to-be the tools and knowledge needed to feel confident on their journey to becoming new fathers. Experienced dads, who have been trained as coaches, teach dads-to-be how to hold, comfort, diaper, swaddle and play with their infant while being able to support the new mom as well. A class for men taught by men. $25/dad Big Brother/Big Sister: Let your children share in the joy of a new brother or sister in this special class designed just for them. Class includes discussion about how families care for babies: swaddling, holding, diapering, safety as well as how they can be helpful in their new role. This class is designed for children ages 72 to 19, but any age is welcome. Please register each child individually. $5/child  Mom Talk: This mom-led group offers support and connection to mothers as they journey through the adjustments and struggles of that sometimes overwhelming first year after the birth of a child. Tuesdays at 10:00am and Thursdays at 6:00pm. Babies welcome. No registration or fee. Breastfeeding Support Group: This group is a mother-to-mother support circle where  moms have the opportunity to share their breastfeeding experiences. A Lactation Consultant is present for questions and concerns. Meets each Tuesday at 11:00am. No fee or registration. Breastfeeding Your Baby: Learn what to expect in the first days of breastfeeding your newborn.  This class will help you feel more confident with the skills needed to begin your breastfeeding experience. Many new mothers are concerned about breastfeeding after leaving the hospital. This class will also address the most common fears and challenges about breastfeeding during the first few weeks, months  and beyond. (call for fee) Comfort Techniques and Tour: This 2 hour interactive class will provide you the opportunity to learn & practice hands-on techniques that can help relieve some of the discomfort of labor and encourage your baby to rotate toward the best position for birth. You and your partner will be able to try a variety of labor positions with birth balls and rebozos as well as practice breathing, relaxation, and visualization techniques. A tour of the [facility] is included with this class. $20 per registrant and support person Childbirth Class- Weekend Option: This class is a Weekend version of our Birth & Baby series. It is designed for parents who have a difficult time fitting several weeks of classes into their schedule. It covers the care of your newborn and the basics of labor and childbirth. It also includes a [place] [facility] and lunch. The class is held two consecutive days: beginning on Friday evening from 6:30 - 8:30 p.m. and the next day, Saturday from 9 a.m. - 4 p.m. (call for fee) Doren Custard Class: Interested in a waterbirth?  This informational class will help you discover whether waterbirth is the right fit for you. Education about waterbirth itself, supplies you would need and how to assemble your support team is what you can expect from this  class. Some obstetrical practices require this class in order to pursue a waterbirth. (Not all obstetrical practices offer waterbirth-check with your healthcare provider.) Register only the expectant mom, but you are encouraged to bring your partner to class! Required if planning waterbirth, no fee. Infant/Child CPR: Parents, grandparents, babysitters, and friends learn Cardio-Pulmonary Resuscitation skills for infants and children. You will also learn how to treat both conscious and unconscious choking in infants and children. This Family & Friends program does not offer certification. Register each participant individually to ensure that enough mannequins are available. (Call for fee) Grandparent Love: Expecting a grandbaby? This class is for you! Learn about the latest infant care and safety recommendations and ways to support your own child as he or she transitions into the parenting role. Taught by Registered Nurses who are childbirth instructors, but most importantly...they are grandmothers too! $10/person. Childbirth Class- Natural Childbirth: This series of 5 weekly classes is for expectant parents who want to learn and practice natural methods of coping with the process of labor and childbirth. Relaxation, breathing, massage, visualization, role of the partner, and helpful positioning are highlighted. Participants learn how to be confident in their body's ability to give birth. This class will empower and help parents make informed decisions about their own care. Includes discussion that will help new parents transition into the immediate postpartum period. [facility] is included. We suggest taking this class between 25-32 weeks, but it's only a recommendation. $75 per registrant and one support person or $30 Medicaid. Childbirth Class- 3 week Series: This option of 3 weekly classes helps you and your labor partner prepare for childbirth. Newborn care, labor & birth,  cesarean birth, pain management, and comfort techniques are discussed and a [place] [facility] is included. The class meets at the same time, on the same day of the week for 3 consecutive weeks beginning with the starting date you choose. $60 for registrant and one support person.  Marvelous Multiples: Expecting twins, triplets, or more? This class covers the differences in labor, birth, parenting, and breastfeeding issues that face multiples' parents. NICU tour is  included. Led by a Certified Childbirth Educator who is the mother of twins. No fee. Caring for Baby: This class is for expectant and adoptive parents who want to learn and practice the most up-to-date newborn care for their babies. Focus is on birth through the first six weeks of life. Topics include feeding, bathing, diapering, crying, umbilical cord care, circumcision care and safe sleep. Parents learn to recognize symptoms of illness and when to call the pediatrician. Register only the mom-to-be and your partner or support person can plan to come with you! $10 per registrant and support person Childbirth Class- online option: This online class offers you the freedom to complete a Birth and Baby series in the comfort of your own home. The flexibility of this option allows you to review sections at your own pace, at times convenient to you and your support people. It includes additional video information, animations, quizzes, and extended activities. Get organized with helpful eClass tools, checklists, and trackers. Once you register online for the class, you will receive an email within a few days to accept the invitation and begin the class when the time is right for you. The content will be available to you for 60 days. $60 for 60 days of online access for you and your support people.  Local Doulas: Natural Baby Doulas [email or web address] Tel: [phone] [email or web address] [employer] [phone] [email or web address] www.[email or web address] The Labor Hassell Halim  (also do waterbirth tub rental) [phone] [email or web address] [facility] Birth Doula [phone] [email or web address] NotebookDistributors.fi Sacred Rhythms  [phone] https://[email or web address]-[email or web address]/ [employer] (PADA) [email or web address] https://[email or web address]/ [place] Birth and Baby  http://[email or web address]/ Considering Waterbirth? Guide for patients at [facility] [employer]  Why consider waterbirth?  . Gentle birth for babies . Less pain medicine used in labor . May allow for passive descent/less pushing . May reduce perineal tears  . More mobility and instinctive maternal position changes . Increased maternal relaxation . Reduced blood pressure in labor  Is waterbirth safe? What are the risks of infection, drowning or other complications?  . Infection: o Very low risk (3.7 % for tub vs 4.8% for bed) o 7 in 8000 waterbirths with documented infection o Poorly cleaned equipment most common cause o Slightly lower group B strep transmission rate  . Drowning o Maternal:  - Very low risk   - Related to seizures or fainting o Newborn:  - Very low risk. No evidence of increased risk of respiratory problems in multiple large studies - Physiological protection from breathing under water - Avoid underwater birth if there are any fetal complications - Once baby's head is out of the water, keep it out.  . Birth complication o Some reports of cord trauma, but risk decreased by bringing baby to surface gradually o No evidence of increased risk of shoulder dystocia. Mothers can usually change positions faster in water than in a bed, possibly aiding the maneuvers to free the shoulder.   You must attend a Doren Custard class at [facility]  3rd Wednesday of every month from  7-9pm  [employer] by calling [phone] or online at [email or web address]  Bring [place] the certificate from the class to your prenatal appointment  Meet with a midwife at 36 weeks to see if you can still plan a waterbirth and to sign the consent.   Purchase or rent the following supplies:   Water Birth Pool (Birth Pool in a Box or [place] for instance)  (Tubs start ~$125)  Single-use disposable tub liner designed for your brand of tub  New garden hose labeled "lead-free", "suitable for drinking water",  Electric drain pump to remove water (We recommend 792 gallon per hour or greater pump.)   Separate garden hose to remove the dirty water  Fish net  Bathing suit top (optional)  Long-handled mirror (optional)  Places to purchase or rent supplies  GotWebTools.is for tub purchases and supplies  Waterbirthsolutions.com for tub purchases and supplies  The Labor Ladies (www.[email or web address]) $275 for tub rental/set-up & take down/kit   [employer] (http://www.fleming.com/.htm) Information regarding doulas (labor support) who provide pool rentals  Our practice has a Birth Pool in a Box tub at the hospital that you may borrow on a first-come-first-served basis. It is your responsibility to to set up, clean and break down the tub. We cannot guarantee the availability of this tub in advance. You are responsible for bringing all accessories listed above. If you do not have all necessary supplies you cannot have a waterbirth.    Things that would prevent you from having a waterbirth:  Premature, <37wks  Previous cesarean birth  Presence of thick meconium-stained fluid  Multiple gestation (Twins, triplets, etc.)  Uncontrolled diabetes or gestational diabetes requiring medication  Hypertension requiring medication or diagnosis of pre-eclampsia  Heavy vaginal bleeding  Non-reassuring fetal heart rate  Active infection (MRSA, etc.). Group  B Strep is NOT a contraindication for  waterbirth.  If your labor has to be induced and induction method requires continuous  monitoring of the baby's heart rate  Other risks/issues identified by your obstetrical provider  Please remember that birth is unpredictable. Under certain unforeseeable circumstances your provider may advise against giving birth in the tub. These decisions will be made on a case-by-case basis and with the safety of you and your baby as our highest priority.

## 2018-08-27 NOTE — Progress Notes (Addendum)
Anatomy ultrasound scheduled for Tuesday Feb 11 @ 0815.  Pt verbalized understanding.

## 2018-08-27 NOTE — Progress Notes (Signed)
  Subjective:    Andrea Espinoza is a [id number] [redacted]w[redacted]d being seen today for her first obstetrical visit at [facility]. Patient is transferring care from [facility] department.  Her obstetrical history is significant for history of severe preeclampsia, HELLP syndrome resulting in preterm delivery. Patient does intend to breast feed. Pregnancy history fully reviewed.  Patient reports no complaints.  Vitals:   08/27/18 1403  BP: 105/70  Pulse: 73  Weight: 176 lb 9.6 oz (80.1 kg)    HISTORY: OB History  Gravida Para Term Preterm AB Living  3 1 0 1 1 1   SAB TAB Ectopic Multiple Live Births  0 1 0 0 1    # Outcome Date GA Lbr Len/2nd Weight Sex Delivery Anes PTL Lv  3 Current           2 Preterm 05/15/16 [redacted]w[redacted]d 03:03 / 00:17 3 lb 8.8 oz (1.61 kg) F Vag-Spont None  LIV     Birth Comments: IOL for severe Pre-E     Complications: Severe pre-eclampsia, HELLP syndrome  1 TAB            Past Medical History:  Diagnosis Date  . Bipolar affective disorder (HCC)   . Complication of anesthesia   . Mental disorder    bipolar, PTSD  . Preeclampsia   . PTSD (post-traumatic stress disorder)    Past Surgical History:  Procedure Laterality Date  . WISDOM TOOTH EXTRACTION     Family History  Problem Relation Age of Onset  . Diabetes Maternal Grandmother      Exam        Assessment:    Pregnancy: [id number] Patient Active Problem List   Diagnosis Date Noted  . Supervision of high risk pregnancy, antepartum 08/18/2018  . History of pre-eclampsia 08/18/2018  . History of HELLP syndrome, currently pregnant 08/18/2018        Plan:     Initial labs drawn. Prenatal vitamins. Problem list reviewed and updated. Genetic Screening discussed :  Done with MFM.  Ultrasound discussed; fetal survey: ordered. Rx ASA provided Baseline labs ordered  Follow up in 4 weeks. 50% of 30 min visit spent on counseling and coordination of care.     Demarea Lorey 08/27/2018

## 2018-08-28 LAB — COMPREHENSIVE METABOLIC PANEL
ALK PHOS: 35 IU/L — AB (ref 39–117)
ALT: 9 IU/L (ref 0–32)
AST: 11 IU/L (ref 0–40)
Albumin/Globulin Ratio: 1.8 (ref 1.2–2.2)
Albumin: 4.1 g/dL (ref 3.5–5.5)
BUN/Creatinine Ratio: 14 (ref 9–23)
BUN: 9 mg/dL (ref 6–20)
Bilirubin Total: 0.5 mg/dL (ref 0.0–1.2)
CO2: 19 mmol/L — ABNORMAL LOW (ref 20–29)
Calcium: 9.1 mg/dL (ref 8.7–10.2)
Chloride: 104 mmol/L (ref 96–106)
Creatinine, Ser: 0.63 mg/dL (ref 0.57–1.00)
GFR calc Af Amer: 149 mL/min/{1.73_m2} (ref >59)
GFR calc non Af Amer: 130 mL/min/{1.73_m2} (ref >59)
Globulin, Total: 2.3 g/dL (ref 1.5–4.5)
Glucose: 75 mg/dL (ref 65–99)
POTASSIUM: 4.5 mmol/L (ref 3.5–5.2)
Sodium: 137 mmol/L (ref 134–144)
Total Protein: 6.4 g/dL (ref 6.0–8.5)

## 2018-08-28 LAB — PROTEIN / CREATININE RATIO, URINE
Creatinine, Urine: 94.9 mg/dL
Protein, Ur: 6 mg/dL
Protein/Creat Ratio: 63 mg/g creat (ref 0–200)

## 2018-08-28 LAB — CBC
Hematocrit: 32.7 % — ABNORMAL LOW (ref 34.0–46.6)
Hemoglobin: 11.1 g/dL (ref 11.1–15.9)
MCH: 26.4 pg — ABNORMAL LOW (ref 26.6–33.0)
MCHC: 33.9 g/dL (ref 31.5–35.7)
MCV: 78 fL — ABNORMAL LOW (ref 79–97)
PLATELETS: 278 10*3/uL (ref 150–450)
RBC: 4.21 x10E6/uL (ref 3.77–5.28)
RDW: 13.7 % (ref 11.7–15.4)
WBC: 9 10*3/uL (ref 3.4–10.8)

## 2018-08-31 ENCOUNTER — Other Ambulatory Visit (HOSPITAL_COMMUNITY): Payer: Self-pay | Admitting: Nurse Practitioner

## 2018-09-04 ENCOUNTER — Other Ambulatory Visit (HOSPITAL_COMMUNITY): Payer: Self-pay | Admitting: Nurse Practitioner

## 2018-09-08 ENCOUNTER — Encounter: Payer: Medicaid Other | Admitting: Medical

## 2018-09-21 ENCOUNTER — Encounter (HOSPITAL_COMMUNITY): Payer: Self-pay

## 2018-09-24 ENCOUNTER — Encounter: Payer: Self-pay | Admitting: Obstetrics and Gynecology

## 2018-09-29 ENCOUNTER — Other Ambulatory Visit: Payer: Self-pay

## 2018-09-29 ENCOUNTER — Ambulatory Visit (INDEPENDENT_AMBULATORY_CARE_PROVIDER_SITE_OTHER): Payer: Self-pay | Admitting: Obstetrics and Gynecology

## 2018-09-29 ENCOUNTER — Telehealth: Payer: Self-pay | Admitting: Obstetrics and Gynecology

## 2018-09-29 ENCOUNTER — Encounter: Payer: Self-pay | Admitting: Obstetrics and Gynecology

## 2018-09-29 ENCOUNTER — Ambulatory Visit (HOSPITAL_COMMUNITY): Payer: Self-pay

## 2018-09-29 VITALS — BP 108/61 | HR 78 | Wt 184.1 lb

## 2018-09-29 DIAGNOSIS — O09292 Supervision of pregnancy with other poor reproductive or obstetric history, second trimester: Secondary | ICD-10-CM

## 2018-09-29 DIAGNOSIS — Z3A18 18 weeks gestation of pregnancy: Secondary | ICD-10-CM

## 2018-09-29 DIAGNOSIS — O0990 Supervision of high risk pregnancy, unspecified, unspecified trimester: Secondary | ICD-10-CM

## 2018-09-29 DIAGNOSIS — O09299 Supervision of pregnancy with other poor reproductive or obstetric history, unspecified trimester: Secondary | ICD-10-CM

## 2018-09-29 DIAGNOSIS — Z8759 Personal history of other complications of pregnancy, childbirth and the puerperium: Secondary | ICD-10-CM

## 2018-09-29 DIAGNOSIS — O0992 Supervision of high risk pregnancy, unspecified, second trimester: Secondary | ICD-10-CM

## 2018-09-29 MED ORDER — DOCUSATE SODIUM 100 MG PO CAPS: 100.0000 mg | ORAL_CAPSULE | Freq: Two times a day (BID) | ORAL | 2 refills | Status: DC | PRN

## 2018-09-29 MED ORDER — ASPIRIN EC 81 MG PO TBEC: 81.0000 mg | DELAYED_RELEASE_TABLET | Freq: Every day | ORAL | 2 refills | Status: DC

## 2018-09-29 NOTE — Patient Instructions (Signed)

## 2018-09-29 NOTE — Telephone Encounter (Signed)
Attempted to call pt w/ her ultrasound appt. No answer, LVM with appt time and date. Left ultrasound number incase needing to reschedule

## 2018-09-29 NOTE — Progress Notes (Signed)
Subjective:  Andrea Espinoza is a 23 y.o. [id number] at 3956w5d being seen today for ongoing prenatal care.  She is currently monitored for the following issues for this high-risk pregnancy and has Supervision of high risk pregnancy, antepartum; History of pre-eclampsia; and History of HELLP syndrome, currently pregnant on their problem list.  Patient reports constipation.  Contractions: Not present. Vag. Bleeding: None.  Movement: Present. Denies leaking of fluid.   The following portions of the patient's history were reviewed and updated as appropriate: allergies, current medications, past family history, past medical history, past social history, past surgical history and problem list. Problem list updated.  Objective:   Vitals:   09/29/18 1125  BP: 108/61  Pulse: 78  Weight: 184 lb 1.6 oz (83.5 kg)    Fetal Status: Fetal Heart Rate (bpm): 145   Movement: Present     General:  Alert, oriented and cooperative. Patient is in no acute distress.  Skin: Skin is warm and dry. No rash noted.   Cardiovascular: Normal heart rate noted  Respiratory: Normal respiratory effort, no problems with respiration noted  Abdomen: Soft, gravid, appropriate for gestational age. Pain/Pressure: Present     Pelvic:  Cervical exam deferred        Extremities: Normal range of motion.  Edema: None  Mental Status: Normal mood and affect. Normal behavior. Normal judgment and thought content.   Urinalysis:      Assessment and Plan:  Pregnancy: [id number] at 2756w5d  1. Supervision of high risk pregnancy, antepartum Stable U/S ordered  2. History of HELLP syndrome, currently pregnant Continue with BASA  3. History of pre-eclampsia As noted above  Preterm labor symptoms and general obstetric precautions including but not limited to vaginal bleeding, contractions, leaking of fluid and fetal movement were reviewed in detail with the patient. Please refer to After Visit Summary for other counseling recommendations.   Return in about 4 weeks (around 10/27/2018) for OB visit.   Hermina StaggersErvin, Kostantinos Tallman L, MD

## 2018-10-02 ENCOUNTER — Ambulatory Visit (HOSPITAL_COMMUNITY): Payer: Self-pay

## 2018-10-09 ENCOUNTER — Ambulatory Visit (HOSPITAL_COMMUNITY): Payer: Medicaid Other

## 2018-10-26 ENCOUNTER — Other Ambulatory Visit (HOSPITAL_COMMUNITY): Payer: Self-pay | Admitting: *Deleted

## 2018-10-26 ENCOUNTER — Other Ambulatory Visit: Payer: Self-pay | Admitting: Obstetrics and Gynecology

## 2018-10-26 ENCOUNTER — Ambulatory Visit (HOSPITAL_COMMUNITY)
Admission: RE | Admit: 2018-10-26 | Discharge: 2018-10-26 | Disposition: A | Discharge status: Home / Self Care | Payer: Medicaid Other | Source: Ambulatory Visit | Attending: Obstetrics and Gynecology | Admitting: Obstetrics and Gynecology

## 2018-10-26 DIAGNOSIS — Z8759 Personal history of other complications of pregnancy, childbirth and the puerperium: Secondary | ICD-10-CM | POA: Diagnosis not present

## 2018-10-26 DIAGNOSIS — O09292 Supervision of pregnancy with other poor reproductive or obstetric history, second trimester: Secondary | ICD-10-CM

## 2018-10-26 DIAGNOSIS — O35EXX0 Maternal care for other (suspected) fetal abnormality and damage, fetal genitourinary anomalies, not applicable or unspecified: Secondary | ICD-10-CM

## 2018-10-26 DIAGNOSIS — O359XX0 Maternal care for (suspected) fetal abnormality and damage, unspecified, not applicable or unspecified: Secondary | ICD-10-CM

## 2018-10-26 DIAGNOSIS — Z3A22 22 weeks gestation of pregnancy: Secondary | ICD-10-CM

## 2018-10-26 DIAGNOSIS — Z363 Encounter for antenatal screening for malformations: Secondary | ICD-10-CM | POA: Diagnosis not present

## 2018-10-26 DIAGNOSIS — O09212 Supervision of pregnancy with history of pre-term labor, second trimester: Secondary | ICD-10-CM

## 2018-10-26 DIAGNOSIS — Z8279 Family history of other congenital malformations, deformations and chromosomal abnormalities: Secondary | ICD-10-CM

## 2018-10-26 DIAGNOSIS — O358XX0 Maternal care for other (suspected) fetal abnormality and damage, not applicable or unspecified: Secondary | ICD-10-CM

## 2018-10-27 ENCOUNTER — Other Ambulatory Visit (HOSPITAL_COMMUNITY)
Admission: RE | Admit: 2018-10-27 | Discharge: 2018-10-27 | Disposition: A | Discharge status: Home / Self Care | Payer: Medicaid Other | Source: Ambulatory Visit | Attending: Obstetrics & Gynecology | Admitting: Obstetrics & Gynecology

## 2018-10-27 ENCOUNTER — Encounter: Payer: Self-pay | Admitting: Internal Medicine

## 2018-10-27 ENCOUNTER — Ambulatory Visit (INDEPENDENT_AMBULATORY_CARE_PROVIDER_SITE_OTHER): Payer: Medicaid Other | Admitting: Obstetrics & Gynecology

## 2018-10-27 VITALS — BP 108/76 | HR 75 | Wt 194.9 lb

## 2018-10-27 DIAGNOSIS — N760 Acute vaginitis: Secondary | ICD-10-CM | POA: Insufficient documentation

## 2018-10-27 DIAGNOSIS — Z3A22 22 weeks gestation of pregnancy: Secondary | ICD-10-CM

## 2018-10-27 DIAGNOSIS — O23592 Infection of other part of genital tract in pregnancy, second trimester: Secondary | ICD-10-CM | POA: Diagnosis not present

## 2018-10-27 DIAGNOSIS — O09299 Supervision of pregnancy with other poor reproductive or obstetric history, unspecified trimester: Secondary | ICD-10-CM

## 2018-10-27 DIAGNOSIS — O0990 Supervision of high risk pregnancy, unspecified, unspecified trimester: Secondary | ICD-10-CM

## 2018-10-27 DIAGNOSIS — O09292 Supervision of pregnancy with other poor reproductive or obstetric history, second trimester: Secondary | ICD-10-CM

## 2018-10-27 DIAGNOSIS — O2602 Excessive weight gain in pregnancy, second trimester: Secondary | ICD-10-CM

## 2018-10-27 NOTE — Progress Notes (Signed)
PRENATAL VISIT NOTE  Subjective:  Andrea Espinoza is a 23 y.o. [id number] at [redacted]w[redacted]d being seen today for ongoing prenatal care.  She is currently monitored for the following issues for this high-risk pregnancy and has Supervision of high risk pregnancy, antepartum; History of pre-eclampsia; and History of HELLP syndrome, currently pregnant on their problem list.  Patient reports vaginal irritation for a few days.  Contractions: Not present. Vag. Bleeding: None.  Movement: Present. Denies leaking of fluid.   The following portions of the patient's history were reviewed and updated as appropriate: allergies, current medications, past family history, past medical history, past social history, past surgical history and problem list.   Objective:   Vitals:   10/27/18 1126  BP: 108/76  Pulse: 75  Weight: 194 lb 14.4 oz (88.4 kg)    Fetal Status: Fetal Heart Rate (bpm): 161   Movement: Present     General:  Alert, oriented and cooperative. Patient is in no acute distress.  Skin: Skin is warm and dry. No rash noted.   Cardiovascular: Normal heart rate noted  Respiratory: Normal respiratory effort, no problems with respiration noted  Abdomen: Soft, gravid, appropriate for gestational age.  Pain/Pressure: Present     Pelvic: Cervical exam deferred        Extremities: Normal range of motion.  Edema: None  Mental Status: Normal mood and affect. Normal behavior. Normal judgment and thought content.   Imaging: [place] Mfm Ob Detail +14 Wk  Result Date: 10/26/2018 ----------------------------------------------------------------------  OBSTETRICS REPORT                       (Signed Final 10/26/2018 12:20 pm) ---------------------------------------------------------------------- Patient Info  ID #:       [id number]                          D.O.B.:  01/02/1996 (22 yrs)  Name:       [name]                    Visit Date: 10/26/2018 11:23 am  ---------------------------------------------------------------------- Performed By  Performed By:     Marcellina Millin          Ref. Address:     [address]                    [facility]                                                             [place], [place]                                                             [address]  Attending:        Noralee Spaceavi Shankar MD        [facility]  [facility]                    Faculty Physician ---------------------------------------------------------------------- Orders   #  Description                          Code         Ordered By   1  US MFM OB DETAIL +14 WK              [id number]     PEGGY CONSTANT  ----------------------------------------------------------------------   #  Order #                    Accession #                 Episode #   1  [id number]                  [id number]                  [id number]  ---------------------------------------------------------------------- Indications   Encounter for antenatal screening for          Z36.3   malformations (low risk NIPS)   [redacted] weeks gestation of pregnancy                Z3A.22   Family history of congenital anomaly (FOB      Z82.79   has child w heart defect & unilateral renal   agenesis)   Poor obstetric history: Previous preterm       O09.219   delivery, antepartum (33wks)   Poor obstetric history: Previous               O09.299   preeclampsia / eclampsia/gestational HTN   Fetal abnormality - other known or             O35.9XX0   suspected (renal pyelectasis)  ---------------------------------------------------------------------- Vital Signs                                                 Height:        5'7" ---------------------------------------------------------------------- Fetal Evaluation  Num Of Fetuses:          1  Fetal Heart Rate(bpm):  168  Cardiac Activity:       Observed  Presentation:           Cephalic  Placenta:               Posterior  P. Cord Insertion:      Visualized  Amniotic Fluid  AFI FV:      Within normal  limits ---------------------------------------------------------------------- Biometry  BPD:      54.9  mm     G. Age:  22w 5d         52  %    CI:        71.35   %    70 - 86                                                          FL/HC:      20.3   %    19.2 - 20.8  HC:       207   mm     G. Age:  22w 6d         46  %    HC/AC:      1.13        1.05 - 1.21  AC:      183.7  mm     G. Age:  23w 1d         62  %    FL/BPD:     76.7   %    71 - 87  FL:       42.1  mm     G. Age:  23w 5d         76  %    FL/AC:      22.9   %    20 - 24  HUM:        40  mm     G. Age:  24w 2d         87  %  CER:      26.3  mm     G. Age:  24w 1d         76  %  Est. FW:     584  gm      1 lb 5 oz     62  % ---------------------------------------------------------------------- OB History  Gravidity:    3         Prem:   1  TOP:          1        Living:  1 ---------------------------------------------------------------------- Gestational Age  LMP:           22w 4d        Date:  05/21/18                 EDD:   02/25/19  U/S Today:     23w 1d                                        EDD:   02/21/19  Best:          22w 4d     Det. By:  LMP  (05/21/18)          EDD:   02/25/19 ---------------------------------------------------------------------- Anatomy  Cranium:               Appears normal         Aortic Arch:            Appears normal  Cavum:  Appears normal         Ductal Arch:            Appears normal  Ventricles:            Appears normal         Diaphragm:              Appears normal  Choroid Plexus:        Appears normal         Stomach:                Appears normal, left                                                                        sided  Cerebellum:            Appears normal          Abdomen:                Appears normal  Posterior Fossa:       Appears normal         Abdominal Wall:         Appears nml (cord                                                                        insert, abd wall)  Nuchal Fold:           Not applicable (>20    Cord Vessels:           Appears normal ([redacted]                         wks GA)                                        vessel cord)  Face:                  Appears normal         Kidneys:                pyelectasis, Rt                         (orbits and profile)                                                                        5.24mm, Lt 4.4 mm  Lips:                  Appears normal  Bladder:                Appears normal  Thoracic:              Appears normal         Spine:                  Appears normal  Heart:                 Appears normal         Upper Extremities:      Appears normal                         (4CH, axis, and                         situs)  RVOT:                  Appears normal         Lower Extremities:      Appears normal  LVOT:                  Appears normal  Other:  Heels visualized. 3VV and 3VTV visualized. Right 5th digit visualized. ---------------------------------------------------------------------- Cervix Uterus Adnexa  Cervix  Length:            3.9  cm.  Normal appearance by transabdominal scan. ---------------------------------------------------------------------- Impression  Ms. Laddie Aquas, G2 P1, is here for fetal anatomy scan. On cell-  free fetal DNA screening, the risks of fetal aneuploidies are  not increased.  Obstetric history is significant for a preterm delivery at 33  weeks (severe preeclampsia). Patient takes low-dose aspirin.  We performed a fetal anatomy scan. Bilateral mild urinary  tract dilations (UTD) are seen. No other markers of  aneuploidies or fetal structural defects are seen. Fetal  biometry is consistent with her previously-established dates.  Amniotic fluid is normal and good fetal activity is  seen.  Patient understands the limitations of ultrasound in detecting  fetal anomalies.  I reassured the patient that in most cases, UTD resolves with  advancing gestation and rarely causes obstructive uropathy.  Given that she had low risk for Down syndrome on cell-free  fetal DNA screening, this marker should be considered a  normal variant. ---------------------------------------------------------------------- Recommendations  -An appointment was made for her to return in 8 weeks for  fetal growth and renal assessments. ----------------------------------------------------------------------                  Noralee Space, MD Electronically Signed Final Report   10/26/2018 12:20 pm ----------------------------------------------------------------------   Assessment and Plan:  Pregnancy: [id number] at [redacted]w[redacted]d 1. Vaginitis affecting pregnancy in second trimester, antepartum - Cervicovaginal ancillary only done, will follow up results and manage accordingly.  2. Excess weight gain in pregnancy, second trimester Has gained 30# so far, diet and exercise recommended. She will talk to Nutritionist next visit.  3. History of HELLP syndrome, currently pregnant Stable BP, continue ASA.  4. Supervision of high risk pregnancy, antepartum Preterm labor symptoms and general obstetric precautions including but not limited to vaginal bleeding, contractions, leaking of fluid and fetal movement were reviewed in detail with the patient. Please refer to After Visit Summary for other counseling recommendations.   Return in about 4 weeks (around 11/24/2018) for OB Visit (HOB) . Also schedule with Nutritinist here in clinic sometime soon..  Future Appointments  Date Time  Provider Department Center  12/21/2018  9:20 AM [facility] NURSE [facility]  12/21/2018  9:30 AM [facility] [place] 1 [facility]-M[facility]US M[facility]-US    Jaynie Collins, MD

## 2018-10-27 NOTE — Patient Instructions (Addendum)
Return to office for any scheduled appointments. Call the office or go to the MAU at [place] at [facility] if:  You begin to have strong, frequent contractions  Your water breaks.  Sometimes it is a big gush of fluid, sometimes it is just a trickle that keeps getting your panties wet or running down your legs  You have vaginal bleeding.  It is normal to have a small amount of spotting if your cervix was checked.   You do not feel your baby moving like normal.  If you do not, get something to eat and drink and lay down and focus on feeling your baby move.   If your baby is still not moving like normal, you should call the office or go to MAU.  Any other obstetric concerns.    Considering Waterbirth? Guide for patients at [facility]  Why consider waterbirth?  . Gentle birth for babies . Less pain medicine used in labor . May allow for passive descent/less pushing . May reduce perineal tears  . More mobility and instinctive maternal position changes . Increased maternal relaxation . Reduced blood pressure in labor  Is waterbirth safe? What are the risks of infection, drowning or other complications?  . Infection: o Very low risk (3.7 % for tub vs 4.8% for bed) o 7 in 8000 waterbirths with documented infection o Poorly cleaned equipment most common cause o Slightly lower group B strep transmission rate  . Drowning o Maternal:  - Very low risk   - Related to seizures or fainting o Newborn:  - Very low risk. No evidence of increased risk of respiratory problems in multiple large studies - Physiological protection from breathing under water - Avoid underwater birth if there are any fetal complications - Once baby's head is out of the water, keep it out.  . Birth complication o Some reports of cord trauma, but risk decreased by bringing baby to surface gradually o No evidence of increased risk of shoulder dystocia. Mothers can usually  change positions faster in water than in a bed, possibly aiding the maneuvers to free the shoulder.   You must attend a Doren Custard class at [facility]  3rd Wednesday of every month from 7-9pm  [employer] by calling [phone] or online at [email or web address]  Bring [place] the certificate from the class to your prenatal appointment  Meet with a midwife at 36 weeks to see if you can still plan a waterbirth and to sign the consent.   If you plan a waterbirth after October 11, 2018, at [facility] at [facility], you will need to purchase the following:  Fish net  Bathing suit top (optional)  Long-handled mirror (optional)  If you plan a waterbirth before October 11, 2018: Purchase or rent the following supplies: You are responsible for providing all supplies listed above. **If you do not have all necessary supplies you cannot have a waterbirth.**   Water Birth Pool (Birth Pool in a Box or [place] for instance)  (Tubs start ~$125)  Single-use disposable tub liner designed for your brand of tub  Electric drain pump to remove water (We recommend 792 gallon per hour or greater pump.)   New garden hose labeled "lead-free", "suitable for drinking water",  Separate garden hose to remove the dirty water  Fish net  Bathing suit top (optional)  Long-handled mirror (optional)  Places to purchase or rent supplies:   [employer] for tub purchases and  supplies  Waterbirthsolutions.com for tub purchases and supplies  [employer] (www.thelaborladies.com) $275 for tub rental/set-up & take down/kit   [employer] (http://www.fleming.com/.htm) Information regarding doulas (labor support) who provide pool rentals  Things that would prevent you from having a waterbirth:  Premature, <37wks  Previous cesarean birth  Presence of thick meconium-stained fluid  Multiple gestation (Twins, triplets,  etc.)  Uncontrolled diabetes or gestational diabetes requiring medication  Hypertension requiring medication or diagnosis of pre-eclampsia  Heavy vaginal bleeding  Non-reassuring fetal heart rate  Active infection (MRSA, etc.). Group B Strep is NOT a contraindication for waterbirth.  If your labor has to be induced and induction method requires continuous monitoring of the baby's heart rate  Other risks/issues identified by your obstetrical provider  Please remember that birth is unpredictable. Under certain unforeseeable circumstances your provider may advise against giving birth in the tub. These decisions will be made on a case-by-case basis and with the safety of you and your baby as our highest priority.

## 2018-10-29 LAB — CERVICOVAGINAL ANCILLARY ONLY
Bacterial vaginitis: NEGATIVE
Candida vaginitis: POSITIVE — AB
Chlamydia: NEGATIVE
Neisseria Gonorrhea: NEGATIVE
Trichomonas: NEGATIVE

## 2018-10-29 MED ORDER — TERCONAZOLE 0.8 % VA CREA: 1.0000 | TOPICAL_CREAM | Freq: Every day | VAGINAL | 0 refills | Status: DC

## 2018-10-29 NOTE — Addendum Note (Signed)
Addended by: Jaynie Collins A on: 10/29/2018 04:39 PM   Modules accepted: Orders

## 2018-11-23 ENCOUNTER — Encounter: Payer: Medicaid Other | Admitting: Family Medicine

## 2018-12-08 ENCOUNTER — Telehealth: Payer: Self-pay | Admitting: Student

## 2018-12-08 NOTE — Telephone Encounter (Signed)
Called the patient to inform of upcoming appointment. The line answered and disconnected the call.

## 2018-12-09 ENCOUNTER — Encounter: Payer: Self-pay | Admitting: Obstetrics and Gynecology

## 2018-12-09 ENCOUNTER — Other Ambulatory Visit: Payer: Self-pay

## 2018-12-09 ENCOUNTER — Other Ambulatory Visit: Payer: Medicaid Other

## 2018-12-09 ENCOUNTER — Ambulatory Visit (INDEPENDENT_AMBULATORY_CARE_PROVIDER_SITE_OTHER): Payer: Medicaid Other | Admitting: Obstetrics and Gynecology

## 2018-12-09 VITALS — BP 105/65 | HR 76 | Wt 206.2 lb

## 2018-12-09 DIAGNOSIS — O35EXX0 Maternal care for other (suspected) fetal abnormality and damage, fetal genitourinary anomalies, not applicable or unspecified: Secondary | ICD-10-CM

## 2018-12-09 DIAGNOSIS — O0990 Supervision of high risk pregnancy, unspecified, unspecified trimester: Secondary | ICD-10-CM

## 2018-12-09 DIAGNOSIS — Z23 Encounter for immunization: Secondary | ICD-10-CM | POA: Diagnosis present

## 2018-12-09 DIAGNOSIS — Z3A28 28 weeks gestation of pregnancy: Secondary | ICD-10-CM

## 2018-12-09 DIAGNOSIS — O0993 Supervision of high risk pregnancy, unspecified, third trimester: Secondary | ICD-10-CM

## 2018-12-09 DIAGNOSIS — O09299 Supervision of pregnancy with other poor reproductive or obstetric history, unspecified trimester: Secondary | ICD-10-CM

## 2018-12-09 DIAGNOSIS — O358XX0 Maternal care for other (suspected) fetal abnormality and damage, not applicable or unspecified: Secondary | ICD-10-CM | POA: Insufficient documentation

## 2018-12-09 DIAGNOSIS — Z8759 Personal history of other complications of pregnancy, childbirth and the puerperium: Secondary | ICD-10-CM

## 2018-12-09 DIAGNOSIS — O09293 Supervision of pregnancy with other poor reproductive or obstetric history, third trimester: Secondary | ICD-10-CM

## 2018-12-09 MED ORDER — ASPIRIN EC 81 MG PO TBEC: 81.0000 mg | DELAYED_RELEASE_TABLET | Freq: Every day | ORAL | 2 refills | Status: DC

## 2018-12-09 NOTE — Progress Notes (Signed)
Prenatal Visit Note Date: 12/09/2018 Clinic: [facility]  Subjective:  Andrea Espinoza is a 23 y.o. [phone] at [redacted]w[redacted]d being seen today for ongoing prenatal care.  She is currently monitored for the following issues for this high-risk pregnancy and has Supervision of high risk pregnancy, antepartum; History of pre-eclampsia; History of HELLP syndrome, currently pregnant; and Pyelectasis of fetus on prenatal ultrasound on their problem list.  Patient reports no complaints.   Contractions: Not present. Vag. Bleeding: None.  Movement: Present. Denies leaking of fluid.   The following portions of the patient's history were reviewed and updated as appropriate: allergies, current medications, past family history, past medical history, past social history, past surgical history and problem list. Problem list updated.  Objective:   Vitals:   12/09/18 0855  BP: 105/65  Pulse: 76  Weight: 206 lb 3.2 oz (93.5 kg)    Fetal Status: Fetal Heart Rate (bpm): 145 Fundal Height: 28 cm Movement: Present     General:  Alert, oriented and cooperative. Patient is in no acute distress.  Skin: Skin is warm and dry. No rash noted.   Cardiovascular: Normal heart rate noted  Respiratory: Normal respiratory effort, no problems with respiration noted  Abdomen: Soft, gravid, appropriate for gestational age. Pain/Pressure: Present     Pelvic:  Cervical exam deferred        Extremities: Normal range of motion.  Edema: None  Mental Status: Normal mood and affect. Normal behavior. Normal judgment and thought content.   Urinalysis:      Assessment and Plan:  Pregnancy: [id number] at [redacted]w[redacted]d  1. Supervision of high risk pregnancy, antepartum Routine care. 28wk labs today - Tdap vaccine greater than or equal to 7yo IM - CHL AMB BABYSCRIPTS SCHEDULE OPTIMIZATION  2. History of pre-eclampsia Refill sent in. Pt set up for home bp monitoring. Recommended she take it 2x/week since dx at 32wks. Pt also  aware of chest pain s/s she had with first pregnancy - aspirin EC 81 MG tablet; Take 1 tablet (81 mg total) by mouth daily.  Dispense: 60 tablet; Refill: 2  3. History of HELLP syndrome, currently pregnant - aspirin EC 81 MG tablet; Take 1 tablet (81 mg total) by mouth daily.  Dispense: 60 tablet; Refill: 2  4. Pyelectasis of fetus on prenatal ultrasound F/u rpt u/s in early may.   Preterm labor symptoms and general obstetric precautions including but not limited to vaginal bleeding, contractions, leaking of fluid and fetal movement were reviewed in detail with the patient. Please refer to After Visit Summary for other counseling recommendations.  Return in about 1 week (around 12/16/2018) for put on RN schedule to call pt about BP values.[place] Bing, MD

## 2018-12-09 NOTE — Progress Notes (Signed)
Patient requests Rx for baby aspirin- has not been taking.

## 2018-12-10 LAB — CBC
Hematocrit: 31.4 % — ABNORMAL LOW (ref 34.0–46.6)
Hemoglobin: 10.7 g/dL — ABNORMAL LOW (ref 11.1–15.9)
MCH: 27.4 pg (ref 26.6–33.0)
MCHC: 34.1 g/dL (ref 31.5–35.7)
MCV: 81 fL (ref 79–97)
Platelets: 245 10*3/uL (ref 150–450)
RBC: 3.9 x10E6/uL (ref 3.77–5.28)
RDW: 13.2 % (ref 11.7–15.4)
WBC: 7.2 10*3/uL (ref 3.4–10.8)

## 2018-12-10 LAB — HIV ANTIBODY (ROUTINE TESTING W REFLEX): HIV Screen 4th Generation wRfx: NONREACTIVE

## 2018-12-10 LAB — GLUCOSE TOLERANCE, 2 HOURS W/ 1HR
Glucose, 1 hour: 93 mg/dL (ref 65–179)
Glucose, 2 hour: 63 mg/dL — ABNORMAL LOW (ref 65–152)
Glucose, Fasting: 83 mg/dL (ref 65–91)

## 2018-12-10 LAB — RPR: RPR Ser Ql: NONREACTIVE

## 2018-12-14 ENCOUNTER — Encounter: Payer: Medicaid Other | Admitting: Obstetrics and Gynecology

## 2018-12-14 ENCOUNTER — Other Ambulatory Visit: Payer: Medicaid Other

## 2018-12-14 ENCOUNTER — Encounter: Payer: Medicaid Other | Admitting: Family Medicine

## 2018-12-16 ENCOUNTER — Ambulatory Visit: Payer: Medicaid Other

## 2018-12-18 ENCOUNTER — Other Ambulatory Visit: Payer: Self-pay

## 2018-12-18 ENCOUNTER — Ambulatory Visit (INDEPENDENT_AMBULATORY_CARE_PROVIDER_SITE_OTHER): Payer: Medicaid Other | Admitting: *Deleted

## 2018-12-18 VITALS — BP 124/75 | HR 73

## 2018-12-18 DIAGNOSIS — Z0130 Encounter for examination of blood pressure without abnormal findings: Secondary | ICD-10-CM

## 2018-12-18 NOTE — Progress Notes (Signed)
Pt called to check blood pressure.  Pt asymptomatic. Reviewed s/s that pt needed to go to MAU.  Pt states she received blood pressure cuff.  Pt BP 124/75 HR 73.  Advised pt to check BP twice a week per provider recommendation and requested she log this into baby scripts. Pt verbalized understanding.

## 2018-12-18 NOTE — Progress Notes (Signed)
Chart reviewed for nurse visit. Agree with plan of care.   Rolm Bookbinder, [place] 12/18/2018 9:36 AM

## 2018-12-21 ENCOUNTER — Ambulatory Visit (HOSPITAL_COMMUNITY)
Admission: RE | Admit: 2018-12-21 | Discharge: 2018-12-21 | Disposition: A | Discharge status: Home / Self Care | Payer: Medicaid Other | Source: Ambulatory Visit | Attending: Obstetrics and Gynecology | Admitting: Obstetrics and Gynecology

## 2018-12-21 ENCOUNTER — Encounter (HOSPITAL_COMMUNITY): Payer: Self-pay

## 2018-12-21 ENCOUNTER — Other Ambulatory Visit: Payer: Self-pay

## 2018-12-21 ENCOUNTER — Ambulatory Visit (HOSPITAL_COMMUNITY): Payer: Medicaid Other | Admitting: *Deleted

## 2018-12-21 VITALS — BP 106/67 | HR 94 | Temp 98.8°F

## 2018-12-21 DIAGNOSIS — O0990 Supervision of high risk pregnancy, unspecified, unspecified trimester: Secondary | ICD-10-CM

## 2018-12-21 DIAGNOSIS — Z8279 Family history of other congenital malformations, deformations and chromosomal abnormalities: Secondary | ICD-10-CM

## 2018-12-21 DIAGNOSIS — O09293 Supervision of pregnancy with other poor reproductive or obstetric history, third trimester: Secondary | ICD-10-CM

## 2018-12-21 DIAGNOSIS — Z3A30 30 weeks gestation of pregnancy: Secondary | ICD-10-CM

## 2018-12-21 DIAGNOSIS — O358XX0 Maternal care for other (suspected) fetal abnormality and damage, not applicable or unspecified: Secondary | ICD-10-CM | POA: Diagnosis not present

## 2018-12-21 DIAGNOSIS — O35EXX0 Maternal care for other (suspected) fetal abnormality and damage, fetal genitourinary anomalies, not applicable or unspecified: Secondary | ICD-10-CM

## 2018-12-21 DIAGNOSIS — O359XX0 Maternal care for (suspected) fetal abnormality and damage, unspecified, not applicable or unspecified: Secondary | ICD-10-CM | POA: Diagnosis not present

## 2018-12-21 DIAGNOSIS — Z362 Encounter for other antenatal screening follow-up: Secondary | ICD-10-CM | POA: Diagnosis not present

## 2018-12-21 DIAGNOSIS — O09213 Supervision of pregnancy with history of pre-term labor, third trimester: Secondary | ICD-10-CM | POA: Diagnosis not present

## 2018-12-24 ENCOUNTER — Encounter: Payer: Medicaid Other | Admitting: Obstetrics and Gynecology

## 2018-12-24 ENCOUNTER — Telehealth: Payer: Self-pay | Admitting: Obstetrics and Gynecology

## 2018-12-24 NOTE — Telephone Encounter (Signed)
Called the patient to confirm the appointment and she stated she's not coming. When asked why she stated she needed something later in the day she cant just leave her kids. I informed of the appointment she has is later in the day. She stated she cant do anything until next week. Scheduled for the next available, as she stated at that point she could do a morning appointment.

## 2019-01-01 ENCOUNTER — Encounter: Payer: Medicaid Other | Admitting: Obstetrics and Gynecology

## 2019-01-04 ENCOUNTER — Telehealth (INDEPENDENT_AMBULATORY_CARE_PROVIDER_SITE_OTHER): Payer: Medicaid Other | Admitting: Obstetrics & Gynecology

## 2019-01-04 ENCOUNTER — Telehealth: Payer: Self-pay | Admitting: Obstetrics & Gynecology

## 2019-01-04 ENCOUNTER — Other Ambulatory Visit: Payer: Self-pay

## 2019-01-04 VITALS — BP 117/73 | HR 85 | Wt 215.0 lb

## 2019-01-04 DIAGNOSIS — Z8759 Personal history of other complications of pregnancy, childbirth and the puerperium: Secondary | ICD-10-CM

## 2019-01-04 DIAGNOSIS — O0993 Supervision of high risk pregnancy, unspecified, third trimester: Secondary | ICD-10-CM

## 2019-01-04 DIAGNOSIS — O0990 Supervision of high risk pregnancy, unspecified, unspecified trimester: Secondary | ICD-10-CM

## 2019-01-04 DIAGNOSIS — Z3A32 32 weeks gestation of pregnancy: Secondary | ICD-10-CM

## 2019-01-04 NOTE — Telephone Encounter (Signed)
Attempted to call patient to let her know that her appointment today 5/18 @ 10:55 will be done through her mychart. No answer, left voicemail with this information and informed patient to make sure that she had access to her mychart before her appointment and if she had any questions or concerns to give the office a call.

## 2019-01-04 NOTE — Progress Notes (Signed)
Pt reports a bp of 117/73 and a pulse of 85.

## 2019-01-04 NOTE — Progress Notes (Signed)
TELEHEALTH OBSTETRICS PRENATAL VIRTUAL VIDEO VISIT ENCOUNTER NOTE  Provider location: [facility] for [employer] at [facility]   I connected with Andrea Espinoza on 01/04/19 at 10:55 AM EDT by WebEx Video Encounter at home and verified that I am speaking with the correct person using two identifiers.   I discussed the limitations, risks, security and privacy concerns of performing an evaluation and management service by telephone and the availability of in person appointments. I also discussed with the patient that there may be a patient responsible charge related to this service. The patient expressed understanding and agreed to proceed. Subjective:  Andrea Espinoza is a 23 y.o. [phone] (4 yo daughter)  at [redacted]w[redacted]d being seen today for ongoing prenatal care.  She is currently monitored for the following issues for this high-risk pregnancy and has Supervision of high risk pregnancy, antepartum; History of pre-eclampsia; History of HELLP syndrome, currently pregnant; and Pyelectasis of fetus on prenatal ultrasound on their problem list.  Patient reports no complaints.   .  .   . Denies any leaking of fluid.   The following portions of the patient's history were reviewed and updated as appropriate: allergies, current medications, past family history, past medical history, past social history, past surgical history and problem list.   Objective:  There were no vitals filed for this visit.  Fetal Status:           General:  Alert, oriented and cooperative. Patient is in no acute distress.  Respiratory: Normal respiratory effort, no problems with respiration noted  Mental Status: Normal mood and affect. Normal behavior. Normal judgment and thought content.  Rest of physical exam deferred due to type of encounter  Imaging: [place] Mfm Ob Follow Up  Result Date: 12/21/2018 ----------------------------------------------------------------------  OBSTETRICS REPORT                       (Signed Final  12/21/2018 11:12 am) ---------------------------------------------------------------------- Patient Info  ID #:       [id number]                          D.O.B.:  1996-05-14 (23 yrs)  Name:       Andrea Espinoza                    Visit Date: 12/21/2018 09:50 am ---------------------------------------------------------------------- Performed By  Performed By:     Sandi Mealy        Ref. Address:     [address]                    RDMS                                                             [place], [place]  [address]  Attending:        Noralee Space MD        [facility]  Referred By:      [facility]-                    Faculty Physician ---------------------------------------------------------------------- Orders   #  Description                          Code         Ordered By   1  [place] MFM OB FOLLOW UP                  [phone]     Noralee Space  ----------------------------------------------------------------------   #  Order #                    Accession #                 Episode #   1  [id number]                  [id number]                  [id number]  ---------------------------------------------------------------------- Indications   Family history of congenital anomaly (FOB      Z82.79   has child w heart defect & unilateral renal   agenesis)   Poor obstetric history: Previous preterm       O09.219   delivery, antepartum (33wks)   Poor obstetric history: Previous               O09.299   preeclampsia / eclampsia/gestational HTN   Fetal abnormality - other known or             O35.9XX0   suspected (renal pyelectasis)   Encounter for other antenatal screening        Z36.2   follow-up   [redacted] weeks gestation of pregnancy                Z3A.30   ---------------------------------------------------------------------- Vital Signs  Weight (lb): 206                               Height:        5'7"  BMI:         32.26 ---------------------------------------------------------------------- Fetal Evaluation  Num Of Fetuses:         1  Fetal Heart Rate(bpm):  146  Cardiac Activity:       Observed  Presentation:           Cephalic  Placenta:               Posterior  P. Cord Insertion:      Visualized  Amniotic Fluid  AFI FV:      Within normal limits  AFI  Sum(cm)     %Tile       Largest Pocket(cm)  11.62           27          3.53  RUQ(cm)       RLQ(cm)       LUQ(cm)        LLQ(cm)  3.53          2.37          3.35           2.37 ---------------------------------------------------------------------- Biometry  BPD:      74.8  mm     G. Age:  30w 0d         22  %    CI:        73.23   %    70 - 86                                                          FL/HC:      22.5   %    19.3 - 21.3  HC:      277.8  mm     G. Age:  30w 3d         13  %    HC/AC:      1.03        0.96 - 1.17  AC:      270.7  mm     G. Age:  31w 1d         63  %    FL/BPD:     83.6   %    71 - 87  FL:       62.5  mm     G. Age:  32w 2d         82  %    FL/AC:      23.1   %    20 - 24  HUM:      52.3  mm     G. Age:  30w 3d         52  %  Est. FW:    1750  gm    3 lb 14 oz      69  % ---------------------------------------------------------------------- OB History  Gravidity:    3         Prem:   1  TOP:          1        Living:  1 ---------------------------------------------------------------------- Gestational Age  LMP:           30w 4d        Date:  05/21/18                 EDD:   02/25/19  U/S Today:     31w 0d                                        EDD:   02/22/19  Best:          30w 4d     Det. By:  LMP  (05/21/18)          EDD:   02/25/19 ----------------------------------------------------------------------  Anatomy  Cranium:               Appears normal         Aortic Arch:             Previously seen  Cavum:                 Appears normal         Ductal Arch:            Previously seen  Ventricles:            Previously seen        Diaphragm:              Appears normal  Choroid Plexus:        Previously seen        Stomach:                Appears normal, left                                                                        sided  Cerebellum:            Previously seen        Abdomen:                Appears normal  Posterior Fossa:       Previously seen        Abdominal Wall:         Previously seen  Nuchal Fold:           Not applicable (>20    Cord Vessels:           Previously seen                         wks GA)  Face:                  Orbits and profile     Kidneys:                Appear normal                         previously seen                                (Pyelectasis                                                                        Resolved)  Lips:                  Appears normal         Bladder:                Appears normal  Thoracic:              Previously  seen        Spine:                  Previously seen  Heart:                 Appears normal         Upper Extremities:      Previously seen                         (4CH, axis, and                         situs)  RVOT:                  Previously seen        Lower Extremities:      Previously seen  LVOT:                  Appears normal  Other:  Heels visualized. 3VV and 3VTV visualized. Right 5th digit visualized. ---------------------------------------------------------------------- Cervix Uterus Adnexa  Cervix  Normal appearance by transabdominal scan. ---------------------------------------------------------------------- Impression  Fetal growth is appropriate for gestational age. Amniotic fluid  is normal and good fetal activity is seen. Pyelectasis has  resolved.  We reassured the patient of the findings. ---------------------------------------------------------------------- Recommendations  Follow-up as  clinically indicated. ----------------------------------------------------------------------                  Noralee Spaceavi Shankar, MD Electronically Signed Final Report   12/21/2018 11:12 am ----------------------------------------------------------------------   Assessment and Plan:  Pregnancy: [id number] at 462w4d 1. Supervision of high risk pregnancy, antepartum   2. History of pre-eclampsia   Preterm labor symptoms and general obstetric precautions including but not limited to vaginal bleeding, contractions, leaking of fluid and fetal movement were reviewed in detail with the patient. I discussed the assessment and treatment plan with the patient. The patient was provided an opportunity to ask questions and all were answered. The patient agreed with the plan and demonstrated an understanding of the instructions. The patient was advised to call back or seek an in-person office evaluation/[facility] for any urgent or concerning symptoms. Please refer to After Visit Summary for other counseling recommendations.   I provided 10 minutes of face-to-face via WebEx time during this encounter.  No follow-ups on file.  No future appointments.  Allie BossierMyra C Keyden Pavlov, MD [facility]

## 2019-01-18 ENCOUNTER — Telehealth (INDEPENDENT_AMBULATORY_CARE_PROVIDER_SITE_OTHER): Payer: Medicaid Other | Admitting: Obstetrics & Gynecology

## 2019-01-18 ENCOUNTER — Encounter: Payer: Self-pay | Admitting: Obstetrics & Gynecology

## 2019-01-18 ENCOUNTER — Other Ambulatory Visit: Payer: Self-pay

## 2019-01-18 VITALS — BP 113/70 | HR 92

## 2019-01-18 DIAGNOSIS — Z3A34 34 weeks gestation of pregnancy: Secondary | ICD-10-CM

## 2019-01-18 DIAGNOSIS — O0990 Supervision of high risk pregnancy, unspecified, unspecified trimester: Secondary | ICD-10-CM

## 2019-01-18 DIAGNOSIS — O09299 Supervision of pregnancy with other poor reproductive or obstetric history, unspecified trimester: Secondary | ICD-10-CM

## 2019-01-18 DIAGNOSIS — O09293 Supervision of pregnancy with other poor reproductive or obstetric history, third trimester: Secondary | ICD-10-CM

## 2019-01-18 NOTE — Progress Notes (Signed)
I connected with  Andrea Espinoza on 01/18/19 at 11:15 AM EDT by telephone and verified that I am speaking with the correct person using two identifiers.   I discussed the limitations, risks, security and privacy concerns of performing an evaluation and management service by telephone and the availability of in person appointments. I also discussed with the patient that there may be a patient responsible charge related to this service. The patient expressed understanding and agreed to proceed.  Osvaldo Human, RN 01/18/2019  11:06 AM  Pt has BP cuff at home and is logging blood pressures in babyscripts regularly.

## 2019-01-18 NOTE — Progress Notes (Signed)
   TELEHEALTH VIRTUAL OBSTETRICS VISIT ENCOUNTER NOTE  I connected with Andrea Espinoza on 01/18/19 at 11:15 AM EDT by telephone at home and verified that I am speaking with the correct person using two identifiers.   I discussed the limitations, risks, security and privacy concerns of performing an evaluation and management service by telephone and the availability of in person appointments. I also discussed with the patient that there may be a patient responsible charge related to this service. The patient expressed understanding and agreed to proceed.  Subjective:  Andrea Espinoza is a 23 y.o. P5T6144 at [redacted]w[redacted]d being followed for ongoing prenatal care.  She is currently monitored for the following issues for this low-risk pregnancy and has Supervision of high risk pregnancy, antepartum; History of pre-eclampsia; History of HELLP syndrome, currently pregnant; and Pyelectasis of fetus on prenatal ultrasound on their problem list.  Patient reports no complaints. Reports fetal movement. Denies any contractions, bleeding or leaking of fluid.   The following portions of the patient's history were reviewed and updated as appropriate: allergies, current medications, past family history, past medical history, past social history, past surgical history and problem list.   Objective:   General:  Alert, oriented and cooperative.   Mental Status: Normal mood and affect perceived. Normal judgment and thought content.  Rest of physical exam deferred due to type of encounter  Assessment and Plan:  Pregnancy: [id number] at [redacted]w[redacted]d 1. Supervision of high risk pregnancy, antepartum   2. History of HELLP syndrome, currently pregnant - on baby asa  Preterm labor symptoms and general obstetric precautions including but not limited to vaginal bleeding, contractions, leaking of fluid and fetal movement were reviewed in detail with the patient.  I discussed the assessment and treatment plan with the patient. The patient  was provided an opportunity to ask questions and all were answered. The patient agreed with the plan and demonstrated an understanding of the instructions. The patient was advised to call back or seek an in-person office evaluation/go to MAU at [facility] for any urgent or concerning symptoms. Please refer to After Visit Summary for other counseling recommendations.   I provided 10 minutes of non-face-to-face time during this encounter. 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 They have been out of school for several weeks. Standing up for long hours gives her pelvic pain. No follow-ups on file.  Future Appointments  Date Time Provider Department Center  02/01/2019  9:15 AM Hermina Staggers, MD [facility]    Allie Bossier, MD Center for [facility], [facility]

## 2019-01-25 ENCOUNTER — Telehealth: Payer: Self-pay | Admitting: Family Medicine

## 2019-01-25 NOTE — Telephone Encounter (Signed)
Left a detailed message about appt change

## 2019-01-26 ENCOUNTER — Encounter: Payer: Medicaid Other | Admitting: Internal Medicine

## 2019-01-26 ENCOUNTER — Telehealth: Payer: Medicaid Other | Admitting: Internal Medicine

## 2019-01-26 ENCOUNTER — Other Ambulatory Visit: Payer: Self-pay

## 2019-01-26 DIAGNOSIS — Z8759 Personal history of other complications of pregnancy, childbirth and the puerperium: Secondary | ICD-10-CM

## 2019-01-26 DIAGNOSIS — O09299 Supervision of pregnancy with other poor reproductive or obstetric history, unspecified trimester: Secondary | ICD-10-CM

## 2019-01-26 DIAGNOSIS — O0990 Supervision of high risk pregnancy, unspecified, unspecified trimester: Secondary | ICD-10-CM

## 2019-01-26 NOTE — Progress Notes (Unsigned)
   TELEHEALTH VIRTUAL OBSTETRICS VISIT ENCOUNTER NOTE  I connected with [name] on 01/26/19 at  4:15 PM EDT by telephone at home and verified that I am speaking with the correct person using two identifiers.   I discussed the limitations, risks, security and privacy concerns of performing an evaluation and management service by telephone and the availability of in person appointments. I also discussed with the patient that there may be a patient responsible charge related to this service. The patient expressed understanding and agreed to proceed.  Subjective:  Andrea Espinoza is a 23 y.o. H2C9470 at [redacted]w[redacted]d being followed for ongoing prenatal care.  She is currently monitored for the following issues for this high-risk pregnancy and has Supervision of high risk pregnancy, antepartum; History of pre-eclampsia; History of HELLP syndrome, currently pregnant; and Pyelectasis of fetus on prenatal ultrasound on their problem list.  Patient reports no complaints. Reports fetal movement. Denies any contractions, bleeding or leaking of fluid. Does note some increased vaginal discharge that is asymptomatic.   The following portions of the patient's history were reviewed and updated as appropriate: allergies, current medications, past family history, past medical history, past social history, past surgical history and problem list.   Objective:   General:  Alert, oriented and cooperative.   Mental Status: Normal mood and affect perceived. Normal judgment and thought content.  Rest of physical exam deferred due to type of encounter  Assessment and Plan:  Pregnancy: [id number] at [redacted]w[redacted]d 1. Supervision of high risk pregnancy, antepartum No acute concerns, continue routine PNC.  Return in 1 week for GBS and GC/Chlamydia.   2. History of pre-eclampsia Reviewed Baby scripts BPs and all within normal limits. Patient not at home at time of visit and unable to obtain BP for this visit. Patient is asymptomatic.   Continue taking baby ASA.    Preterm labor symptoms and general obstetric precautions including but not limited to vaginal bleeding, contractions, leaking of fluid and fetal movement were reviewed in detail with the patient.  I discussed the assessment and treatment plan with the patient. The patient was provided an opportunity to ask questions and all were answered. The patient agreed with the plan and demonstrated an understanding of the instructions. The patient was advised to call back or seek an in-person office evaluation/go to MAU at [facility] for any urgent or concerning symptoms. Please refer to After Visit Summary for other counseling recommendations.   I provided 5 minutes of non-face-to-face time during this encounter.  Return in about 1 week (around 02/02/2019) for high risk OB.  No future appointments.  Melina Schools, [place] for [employer], [place]

## 2019-02-01 ENCOUNTER — Telehealth: Payer: Self-pay | Admitting: Advanced Practice Midwife

## 2019-02-01 ENCOUNTER — Encounter: Payer: Medicaid Other | Admitting: Obstetrics and Gynecology

## 2019-02-01 NOTE — Telephone Encounter (Signed)
Informed the patient of the new location, wearing a face mask, sanitizing hands at the sanitizing station near the door, no visitors due to the COVID19 restrictions. The patient verbalized understanding. °

## 2019-02-02 ENCOUNTER — Telehealth: Payer: Self-pay | Admitting: Family Medicine

## 2019-02-02 NOTE — Telephone Encounter (Signed)
Patient was called and instructed about her visit for 02/03/2019. °

## 2019-02-03 ENCOUNTER — Other Ambulatory Visit (HOSPITAL_COMMUNITY)
Admission: RE | Admit: 2019-02-03 | Discharge: 2019-02-03 | Disposition: A | Discharge status: Home / Self Care | Payer: Medicaid Other | Source: Ambulatory Visit | Attending: Family Medicine | Admitting: Family Medicine

## 2019-02-03 ENCOUNTER — Encounter: Payer: Self-pay | Admitting: Family Medicine

## 2019-02-03 ENCOUNTER — Other Ambulatory Visit: Payer: Self-pay

## 2019-02-03 ENCOUNTER — Ambulatory Visit (INDEPENDENT_AMBULATORY_CARE_PROVIDER_SITE_OTHER): Payer: Medicaid Other | Admitting: Family Medicine

## 2019-02-03 VITALS — BP 116/79 | HR 92 | Temp 98.8°F | Wt 225.9 lb

## 2019-02-03 DIAGNOSIS — O0990 Supervision of high risk pregnancy, unspecified, unspecified trimester: Secondary | ICD-10-CM

## 2019-02-03 DIAGNOSIS — O0993 Supervision of high risk pregnancy, unspecified, third trimester: Secondary | ICD-10-CM

## 2019-02-03 DIAGNOSIS — Z3A37 37 weeks gestation of pregnancy: Secondary | ICD-10-CM

## 2019-02-03 NOTE — Patient Instructions (Signed)

## 2019-02-04 LAB — GC/CHLAMYDIA PROBE AMP (~~LOC~~) NOT AT ARMC
Chlamydia: NEGATIVE
Neisseria Gonorrhea: NEGATIVE

## 2019-02-04 NOTE — Progress Notes (Signed)
   PRENATAL VISIT NOTE  Subjective:  Andrea Espinoza is a 23 y.o. [phone] at [redacted]w[redacted]d being seen today for ongoing prenatal care.  She is currently monitored for the following issues for this high-risk pregnancy and has Supervision of high risk pregnancy, antepartum; History of pre-eclampsia; History of HELLP syndrome, currently pregnant; and Pyelectasis of fetus on prenatal ultrasound on their problem list.  Patient reports no complaints.  Contractions: Not present. Vag. Bleeding: None.  Movement: Present. Denies leaking of fluid.   The following portions of the patient's history were reviewed and updated as appropriate: allergies, current medications, past family history, past medical history, past social history, past surgical history and problem list.   Objective:   Vitals:   02/03/19 1126  BP: 116/79  Pulse: 92  Temp: 98.8 F (37.1 C)  Weight: 225 lb 14.4 oz (102.5 kg)    Fetal Status: Fetal Heart Rate (bpm): 154 Fundal Height: 35 cm Movement: Present  Presentation: Vertex  General:  Alert, oriented and cooperative. Patient is in no acute distress.  Skin: Skin is warm and dry. No rash noted.   Cardiovascular: Normal heart rate noted  Respiratory: Normal respiratory effort, no problems with respiration noted  Abdomen: Soft, gravid, appropriate for gestational age.  Pain/Pressure: Absent     Pelvic: Cervical exam performed Dilation: 1.5 Effacement (%): 60 Station: -2  Extremities: Normal range of motion.  Edema: None  Mental Status: Normal mood and affect. Normal behavior. Normal judgment and thought content.   Assessment and Plan:  Pregnancy: O2H4765 at [redacted]w[redacted]d 1. Supervision of high risk pregnancy, antepartum 36 wk cultures today - Culture, beta strep (group b only) - GC/Chlamydia probe amp ([place])not at [facility]  Term labor symptoms and general obstetric precautions including but not limited to vaginal bleeding, contractions, leaking of fluid and fetal movement were reviewed in  detail with the patient. Please refer to After Visit Summary for other counseling recommendations.   Return in 1 week (on 02/10/2019), or virtual.  Future Appointments  Date Time Provider [place]  02/10/2019  2:15 PM Anyanwu, Sallyanne Havers, MD [facility]  02/17/2019  2:35 PM Anyanwu, Sallyanne Havers, MD [place]  03/01/2019 10:15 AM Woodroe Mode, MD [facility]    Donnamae Jude, MD

## 2019-02-07 LAB — CULTURE, BETA STREP (GROUP B ONLY): Strep Gp B Culture: NEGATIVE

## 2019-02-10 ENCOUNTER — Other Ambulatory Visit: Payer: Self-pay

## 2019-02-10 ENCOUNTER — Encounter: Payer: Self-pay | Admitting: Obstetrics & Gynecology

## 2019-02-10 ENCOUNTER — Telehealth (INDEPENDENT_AMBULATORY_CARE_PROVIDER_SITE_OTHER): Payer: Medicaid Other | Admitting: Obstetrics & Gynecology

## 2019-02-10 DIAGNOSIS — O0993 Supervision of high risk pregnancy, unspecified, third trimester: Secondary | ICD-10-CM

## 2019-02-10 DIAGNOSIS — Z3A37 37 weeks gestation of pregnancy: Secondary | ICD-10-CM

## 2019-02-10 DIAGNOSIS — O09299 Supervision of pregnancy with other poor reproductive or obstetric history, unspecified trimester: Secondary | ICD-10-CM

## 2019-02-10 DIAGNOSIS — O09293 Supervision of pregnancy with other poor reproductive or obstetric history, third trimester: Secondary | ICD-10-CM

## 2019-02-10 DIAGNOSIS — O0990 Supervision of high risk pregnancy, unspecified, unspecified trimester: Secondary | ICD-10-CM

## 2019-02-10 DIAGNOSIS — Z8759 Personal history of other complications of pregnancy, childbirth and the puerperium: Secondary | ICD-10-CM

## 2019-02-10 NOTE — Patient Instructions (Signed)
Return to office for any scheduled appointments. Call the office or go to the [facility] [place] if:  You begin to have strong, frequent contractions  Your water breaks.  Sometimes it is a big gush of fluid, sometimes it is just a trickle that keeps getting your panties wet or running down your legs  You have vaginal bleeding.  It is normal to have a small amount of spotting if your cervix was checked.   You do not feel your baby moving like normal.  If you do not, get something to eat and drink and lay down and focus on feeling your baby move.   If your baby is still not moving like normal, you should call the office or go to MAU.  Any other obstetric concerns.   

## 2019-02-10 NOTE — Progress Notes (Signed)
   TELEHEALTH OBSTETRICS PRENATAL VIRTUAL VIDEO VISIT ENCOUNTER NOTE  Provider location: [facility] for [employer] at [facility]   I connected with [name] on 02/10/19 at  2:15 PM EDT by Doximity Video Encounter at home and verified that I am speaking with the correct person using two identifiers.   I discussed the limitations, risks, security and privacy concerns of performing an evaluation and management service by telephone and the availability of in person appointments. I also discussed with the patient that there may be a patient responsible charge related to this service. The patient expressed understanding and agreed to proceed. Subjective:  Andrea Espinoza is a 23 y.o. [phone] at [redacted]w[redacted]d being seen today for ongoing prenatal care.  She is currently monitored for the following issues for this high-risk pregnancy and has Supervision of high risk pregnancy, antepartum; History of pre-eclampsia; History of HELLP syndrome, currently pregnant; and Pyelectasis of fetus on prenatal ultrasound on their problem list.  Patient reports no complaints.  Contractions: Not present. Vag. Bleeding: None.  Movement: Present. Denies any leaking of fluid.   The following portions of the patient's history were reviewed and updated as appropriate: allergies, current medications, past family history, past medical history, past social history, past surgical history and problem list.   Objective:   Vitals:   02/10/19 1338  BP: 108/67  Normal BP readings on Babyscripts  Fetal Status:     Movement: Present     General:  Alert, oriented and cooperative. Patient is in no acute distress.  Respiratory: Normal respiratory effort, no problems with respiration noted  Mental Status: Normal mood and affect. Normal behavior. Normal judgment and thought content.  Rest of physical exam deferred due to type of encounter  Imaging: No results found.  Assessment and Plan:  Pregnancy: [id number] at [redacted]w[redacted]d 1. History of  pre-eclampsia 2. History of HELLP syndrome, currently pregnant Stable BP. Preeclampsia precautions reviewed.  3. Supervision of high risk pregnancy, antepartum Term labor symptoms and general obstetric precautions including but not limited to vaginal bleeding, contractions, leaking of fluid and fetal movement were reviewed in detail with the patient. I discussed the assessment and treatment plan with the patient. The patient was provided an opportunity to ask questions and all were answered. The patient agreed with the plan and demonstrated an understanding of the instructions. The patient was advised to call back or seek an in-person office evaluation/go to MAU at [facility] for any urgent or concerning symptoms. Please refer to After Visit Summary for other counseling recommendations.   I provided 7 minutes of face-to-face time during this encounter.  Return in about 1 week (around 02/17/2019) for Virtual [facility] Visit.  Future Appointments  Date Time Provider [place]  02/10/2019  2:15 PM Donie Lemelin, Sallyanne Havers, MD [facility] [place]  02/17/2019  2:35 PM Kiaya Haliburton, Sallyanne Havers, MD [facility]  03/01/2019 10:15 AM Woodroe Mode, MD [place] Renette Hsu, [place] for [facility], [place]

## 2019-02-16 ENCOUNTER — Telehealth: Payer: Self-pay | Admitting: Advanced Practice Midwife

## 2019-02-16 NOTE — Telephone Encounter (Signed)
The patient stated she has issues with mychart. Offered the help desk number however she stated she is driving. Informed the patient if we have issues we will give her a call tomorrow. She stated the last few visits were completed via a different system and it has not been webex. Please contact patient prior to visit.

## 2019-02-17 ENCOUNTER — Encounter (HOSPITAL_COMMUNITY): Payer: Self-pay

## 2019-02-17 ENCOUNTER — Telehealth: Payer: Medicaid Other | Admitting: Obstetrics & Gynecology

## 2019-02-17 ENCOUNTER — Inpatient Hospital Stay (HOSPITAL_COMMUNITY)
Admission: AD | Admit: 2019-02-17 | Discharge: 2019-02-17 | Disposition: A | Discharge status: Home / Self Care | Payer: Medicaid Other | Attending: Obstetrics and Gynecology | Admitting: Obstetrics and Gynecology

## 2019-02-17 ENCOUNTER — Other Ambulatory Visit: Payer: Self-pay

## 2019-02-17 ENCOUNTER — Encounter: Payer: Self-pay | Admitting: Obstetrics & Gynecology

## 2019-02-17 DIAGNOSIS — O09293 Supervision of pregnancy with other poor reproductive or obstetric history, third trimester: Secondary | ICD-10-CM | POA: Diagnosis not present

## 2019-02-17 DIAGNOSIS — Z7982 Long term (current) use of aspirin: Secondary | ICD-10-CM | POA: Insufficient documentation

## 2019-02-17 DIAGNOSIS — Z3A38 38 weeks gestation of pregnancy: Secondary | ICD-10-CM | POA: Diagnosis not present

## 2019-02-17 DIAGNOSIS — Z3689 Encounter for other specified antenatal screening: Secondary | ICD-10-CM

## 2019-02-17 DIAGNOSIS — O0990 Supervision of high risk pregnancy, unspecified, unspecified trimester: Secondary | ICD-10-CM

## 2019-02-17 DIAGNOSIS — O0993 Supervision of high risk pregnancy, unspecified, third trimester: Secondary | ICD-10-CM | POA: Diagnosis not present

## 2019-02-17 DIAGNOSIS — R030 Elevated blood-pressure reading, without diagnosis of hypertension: Secondary | ICD-10-CM

## 2019-02-17 DIAGNOSIS — O26893 Other specified pregnancy related conditions, third trimester: Secondary | ICD-10-CM | POA: Diagnosis not present

## 2019-02-17 DIAGNOSIS — Z8759 Personal history of other complications of pregnancy, childbirth and the puerperium: Secondary | ICD-10-CM

## 2019-02-17 DIAGNOSIS — O09299 Supervision of pregnancy with other poor reproductive or obstetric history, unspecified trimester: Secondary | ICD-10-CM

## 2019-02-17 DIAGNOSIS — R51 Headache: Secondary | ICD-10-CM | POA: Diagnosis present

## 2019-02-17 DIAGNOSIS — Z87891 Personal history of nicotine dependence: Secondary | ICD-10-CM | POA: Insufficient documentation

## 2019-02-17 LAB — CBC
HCT: 32.2 % — ABNORMAL LOW (ref 36.0–46.0)
Hemoglobin: 11 g/dL — ABNORMAL LOW (ref 12.0–15.0)
MCH: 27.3 pg (ref 26.0–34.0)
MCHC: 34.2 g/dL (ref 30.0–36.0)
MCV: 79.9 fL — ABNORMAL LOW (ref 80.0–100.0)
Platelets: 166 10*3/uL (ref 150–400)
RBC: 4.03 MIL/uL (ref 3.87–5.11)
RDW: 15.3 % (ref 11.5–15.5)
WBC: 6.8 10*3/uL (ref 4.0–10.5)
nRBC: 0 % (ref 0.0–0.2)

## 2019-02-17 LAB — COMPREHENSIVE METABOLIC PANEL
ALT: 14 U/L (ref 0–44)
AST: 15 U/L (ref 15–41)
Albumin: 2.7 g/dL — ABNORMAL LOW (ref 3.5–5.0)
Alkaline Phosphatase: 164 U/L — ABNORMAL HIGH (ref 38–126)
Anion gap: 8 (ref 5–15)
BUN: 13 mg/dL (ref 6–20)
CO2: 19 mmol/L — ABNORMAL LOW (ref 22–32)
Calcium: 9.1 mg/dL (ref 8.9–10.3)
Chloride: 110 mmol/L (ref 98–111)
Creatinine, Ser: 0.84 mg/dL (ref 0.44–1.00)
GFR calc Af Amer: >60 mL/min (ref >60)
GFR calc non Af Amer: >60 mL/min (ref >60)
Glucose, Bld: 95 mg/dL (ref 70–99)
Potassium: 3.7 mmol/L (ref 3.5–5.1)
Sodium: 137 mmol/L (ref 135–145)
Total Bilirubin: 0.4 mg/dL (ref 0.3–1.2)
Total Protein: 5.4 g/dL — ABNORMAL LOW (ref 6.5–8.1)

## 2019-02-17 LAB — PROTEIN / CREATININE RATIO, URINE
Creatinine, Urine: 276.1 mg/dL
Protein Creatinine Ratio: 0.17 mg/mg{Cre} — ABNORMAL HIGH (ref 0.00–0.15)
Total Protein, Urine: 48 mg/dL

## 2019-02-17 MED ORDER — BUTALBITAL-APAP-CAFFEINE 50-325-40 MG PO TABS
2.0000 | ORAL_TABLET | Freq: Four times a day (QID) | ORAL | Status: DC | PRN
  Administered 2019-02-17: 2 via ORAL
  Filled 2019-02-17: qty 2

## 2019-02-17 NOTE — MAU Provider Note (Signed)
History     CSN: [id number]  Arrival date and time: 02/17/19 1559   First Provider Initiated Contact with Patient 02/17/19 1651      Chief Complaint  Patient presents with  . Headache  . Hypertension  . seeing spots  . Foot Swelling  . Facial Swelling   Andrea Espinoza is a 23 y.o. [id number] at [redacted]w[redacted]d who receives care at [facility].  She presents today for Headache, Hypertension, seeing spots, Foot Swelling, and Facial Swelling.  Patient states she took her blood pressure earlier today and had some elevated pressures 140s/90s.  She states she took her blood pressures after resting.  She states that she has had a headache for 3 days and currently rates it a 4 stating it was worse a few days ago. She states the tylenol helps for a couple of hours, but the headache soon returns.  Patient also reports "seeing spots" that occurred around 12pm today, but denies blurry or double vision.  She states the swelling has been fluctuating throughout the week.  She endorses fetal movement and denies contractions, LoF, and vaginal bleeding/discharge.       OB History    Gravida  3   Para  1   Term  0   Preterm  1   AB  1   Living  1     SAB  0   TAB  1   Ectopic  0   Multiple  0   Live Births  1           Past Medical History:  Diagnosis Date  . Bipolar affective disorder ([place])   . Complication of anesthesia   . Mental disorder    bipolar, PTSD  . Preeclampsia   . PTSD (post-traumatic stress disorder)     Past Surgical History:  Procedure Laterality Date  . WISDOM TOOTH EXTRACTION      Family History  Problem Relation Age of Onset  . Diabetes Maternal Grandmother     Social History   Tobacco Use  . Smoking status: Former Smoker    Types: Cigarettes    Quit date: 09/11/2015    Years since quitting: 3.4  . Smokeless tobacco: Never Used  Substance Use Topics  . Alcohol use: No  . Drug use: No    Types: Marijuana    Allergies: No Known Allergies  Medications  Prior to Admission  Medication Sig Dispense Refill Last Dose  . aspirin EC 81 MG tablet Take 1 tablet (81 mg total) by mouth daily. 60 tablet 2 02/16/2019 at Unknown time  . Prenatal Multivit-Min-Fe-FA (PRENATAL VITAMINS PO) Take by mouth.   02/17/2019 at Unknown time  . Prenatal Vit-Fe Fumarate-FA (PRENATAL VITAMIN PLUS LOW IRON) 27-1 MG TABS Take 1 tablet by mouth daily.       Review of Systems  Constitutional: Negative for chills and fever.  Eyes: Positive for visual disturbance.  Respiratory: Negative for cough and shortness of breath.   Gastrointestinal: Positive for abdominal pain ("Just part of the process" Constant shooting or pressure. ). Negative for constipation, diarrhea, nausea and vomiting.  Genitourinary: Positive for pelvic pain. Negative for difficulty urinating, dysuria, vaginal bleeding and vaginal discharge.  Musculoskeletal: Positive for back pain.  Neurological: Positive for headaches. Negative for dizziness and light-headedness.   Physical Exam   Blood pressure 128/80, pulse 84, temperature 98.7 F (37.1 C), temperature source Oral, resp. rate 16, weight 105 kg, last menstrual period 05/21/2018, SpO2 100 %, unknown if currently breastfeeding.  Physical Exam  Constitutional: She is oriented to person, place, and time. She appears well-developed and well-nourished.  HENT:  Head: Normocephalic and atraumatic.  Eyes: Conjunctivae are normal.  Neck: Normal range of motion.  Cardiovascular: Normal rate and normal heart sounds.  Respiratory: Effort normal.  GI: Soft.  Musculoskeletal: Normal range of motion.  Neurological: She is alert and oriented to person, place, and time.  Skin: Skin is warm and dry.  Psychiatric: She has a normal mood and affect. Her behavior is normal.    Fetal Assessment 145 bpm, Mod Var, -Decels, +Accels Toco: None graphed  MAU Course   Results for orders placed or performed during the hospital encounter of 02/17/19 (from the past 24  hour(s))  Protein / creatinine ratio, urine     Status: Abnormal   Collection Time: 02/17/19  4:40 PM  Result Value Ref Range   Creatinine, Urine 276.10 mg/dL   Total Protein, Urine 48 mg/dL   Protein Creatinine Ratio 0.17 (H) 0.00 - 0.15 mg/mg[Cre]  CBC     Status: Abnormal   Collection Time: 02/17/19  4:47 PM  Result Value Ref Range   WBC 6.8 4.0 - 10.5 K/uL   RBC 4.03 3.87 - 5.11 MIL/uL   Hemoglobin 11.0 (L) 12.0 - 15.0 g/dL   HCT 16.132.2 (L) 09.636.0 - 04.546.0 %   MCV 79.9 (L) 80.0 - 100.0 fL   MCH 27.3 26.0 - 34.0 pg   MCHC 34.2 30.0 - 36.0 g/dL   RDW 40.915.3 81.111.5 - 91.415.5 %   Platelets 166 150 - 400 K/uL   nRBC 0.0 0.0 - 0.2 %  Comprehensive metabolic panel     Status: Abnormal   Collection Time: 02/17/19  4:47 PM  Result Value Ref Range   Sodium 137 135 - 145 mmol/L   Potassium 3.7 3.5 - 5.1 mmol/L   Chloride 110 98 - 111 mmol/L   CO2 19 (L) 22 - 32 mmol/L   Glucose, Bld 95 70 - 99 mg/dL   BUN 13 6 - 20 mg/dL   Creatinine, Ser 7.820.84 0.44 - 1.00 mg/dL   Calcium 9.1 8.9 - 95.610.3 mg/dL   Total Protein 5.4 (L) 6.5 - 8.1 g/dL   Albumin 2.7 (L) 3.5 - 5.0 g/dL   AST 15 15 - 41 U/L   ALT 14 0 - 44 U/L   Alkaline Phosphatase 164 (H) 38 - 126 U/L   Total Bilirubin 0.4 0.3 - 1.2 mg/dL   GFR calc non Af Amer >60 >60 mL/min   GFR calc Af Amer >60 >60 mL/min   Anion gap 8 5 - 15   No results found.  MDM Physical Exam Labs: CBC, CMP, PC Ratio Measure BPQ15 min EFM Pain Management Assessment and Plan  23 year old G3P0111  SIUP at 38.6 weeks Cat I FT Elevated BP  -Exam findings discussed. -Offered and accepts pain medication. -Fiorcet ordered. -NST reactive   Follow Up (5:50 PM)  -PIH labs return negative -PC Ratio insignificant at 0.17 -In room to discuss results with patient. -Patient questions when she can be induced and informed of induction at 41 weeks if necessary. -Patient questions when she will have a cervical exam and informed that would occur at next office visit or  prn. -Patient states she has not received medication for headache. -Orders reviewed and fiorcet order not placed. -Will give now and review.  Follow Up (6:32 PM)  -Dr. Despina HiddenEure consulted and agrees with discharge with close follow up. -[facility] with request for visit on Friday. -In room to discuss with patient who verbalizes understanding. -Patient reports improvement in HA. -Patient without questions or concerns. -Encouraged to call or return to MAU if symptoms worsen or with the onset of new symptoms. -Discharged to home in improved condition  Cherre RobinsJessica L Jalexus Brett MSN, CNM 02/17/2019, 4:51 PM

## 2019-02-17 NOTE — MAU Note (Signed)
BP was elevated. First time elevated this preg.  Hx of PRe-E with last preg.  Has been having HAs the past couple days, also seeing dots. Little relief with Tylenol. Denies epigastric pain. Reports increased swelling in face and feet.

## 2019-02-17 NOTE — Progress Notes (Signed)
I connected with  [name] on 02/17/19 at  2:35 PM EDT by telephone and verified that I am speaking with the correct person using two identifiers.   I discussed the limitations, risks, security and privacy concerns of performing an evaluation and management service by telephone and the availability of in person appointments. I also discussed with the patient that there may be a patient responsible charge related to this service. The patient expressed understanding and agreed to proceed.  Patient expressed 3 elevated blood pressures and per Dr.Anyanwu Patient needs to go to MAU for evaluation based off her history of Severe Pre E. Patient verbalized understanding.    [place], [place] 02/17/2019  3:09 PM

## 2019-02-17 NOTE — Discharge Instructions (Signed)
Hypertension During Pregnancy °High blood pressure (hypertension) is when the force of blood pumping through the arteries is too strong. Arteries are blood vessels that carry blood from the heart throughout the body. Hypertension during pregnancy can be mild or severe. Severe hypertension during pregnancy (preeclampsia) is a medical emergency that requires prompt evaluation and treatment. °Different types of hypertension can happen during pregnancy. These include: °· Chronic hypertension. This happens when you had high blood pressure before you became pregnant, and it continues during the pregnancy. Hypertension that develops before you are [redacted] weeks pregnant and continues during the pregnancy is also called chronic hypertension. If you have chronic hypertension, it will not go away after you have your baby. You will need follow-up visits with your health care provider after you have your baby. Your doctor may want you to keep taking medicine for your blood pressure. °· Gestational hypertension. This is hypertension that develops after the 20th week of pregnancy. Gestational hypertension usually goes away after you have your baby, but your health care provider will need to monitor your blood pressure to make sure that it is getting better. °· Preeclampsia. This is severe hypertension during pregnancy. This can cause serious complications for you and your baby and can also cause complications for you after the delivery of your baby. °· Postpartum preeclampsia. You may develop severe hypertension after giving birth. This usually occurs within 48 hours after childbirth but may occur up to 6 weeks after giving birth. This is rare. °How does this affect me? °Women who have hypertension during pregnancy have a greater chance of developing hypertension later in life or during future pregnancies. In some cases, hypertension during pregnancy can cause serious complications, such as: °· Stroke. °· Heart attack. °· Injury to  other organs, such as kidneys, lungs, or liver. °· Preeclampsia. °· Convulsions or seizures. °· Placental abruption. °How does this affect my baby? °Hypertension during pregnancy can affect your baby. Your baby may: °· Be born early (prematurely). °· Not weigh as much as he or she should at birth (low birth weight). °· Not tolerate labor well, leading to an unplanned cesarean delivery. °What are the risks? °There are certain factors that make it more likely for you to develop hypertension during pregnancy. These include: °· Having hypertension during a previous pregnancy. °· Being overweight. °· Being age 35 or older. °· Being pregnant for the first time. °· Being pregnant with more than one baby. °· Becoming pregnant using fertilization methods, such as IVF (in vitro fertilization). °· Having other medical problems, such as diabetes, kidney disease, or lupus. °· Having a family history of hypertension. °What can I do to lower my risk? °The exact cause of hypertension during pregnancy is not known. You may be able to lower your risk by: °· Maintaining a healthy weight. °· Eating a healthy and balanced diet. °· Following your health care provider's instructions about treating any long-term conditions that you had before becoming pregnant. °It is very important to keep all of your prenatal care appointments. Your health care provider will check your blood pressure and make sure that your pregnancy is progressing as expected. If a problem is found, early treatment can prevent complications. °How is this treated? °Treatment for hypertension during pregnancy varies depending on the type of hypertension you have and how serious it is. °· If you were taking medicine for high blood pressure before you became pregnant, talk with your health care provider. You may need to change medicine during pregnancy because   some medicines, like ACE inhibitors, may not be considered safe for your baby. °· If you have gestational  hypertension, your health care provider may order medicine to treat this during pregnancy. °· If you are at risk for preeclampsia, your health care provider may recommend that you take a low-dose aspirin during your pregnancy. °· If you have severe hypertension, you may need to be hospitalized so you and your baby can be monitored closely. You may also need to be given medicine to lower your blood pressure. This medicine may be given by mouth or through an IV. °· In some cases, if your condition gets worse, you may need to deliver your baby early. °Follow these instructions at home: °Eating and drinking ° °· Drink enough fluid to keep your urine pale yellow. °· Avoid caffeine. °Lifestyle °· Do not use any products that contain nicotine or tobacco, such as cigarettes, e-cigarettes, and chewing tobacco. If you need help quitting, ask your health care provider. °· Do not use alcohol or drugs. °· Avoid stress as much as possible. °· Rest and get plenty of sleep. °· Regular exercise can help to reduce your blood pressure. Ask your health care provider what kinds of exercise are best for you. °General instructions °· Take over-the-counter and prescription medicines only as told by your health care provider. °· Keep all prenatal and follow-up visits as told by your health care provider. This is important. °Contact a health care provider if: °· You have symptoms that your health care provider told you may require more treatment or monitoring, such as: °? Headaches. °? Nausea or vomiting. °? Abdominal pain. °? Dizziness. °? Light-headedness. °Get help right away if: °· You have: °? Severe abdominal pain that does not get better with treatment. °? A severe headache that does not get better. °? Vomiting that does not get better. °? Sudden, rapid weight gain. °? Sudden swelling in your hands, ankles, or face. °? Vaginal bleeding. °? Blood in your urine. °? Blurred or double vision. °? Shortness of breath or chest  pain. °? Weakness on one side of your body. °? Difficulty speaking. °· Your baby is not moving as much as usual. °Summary °· High blood pressure (hypertension) is when the force of blood pumping through the arteries is too strong. °· Hypertension during pregnancy can cause problems for you and your baby. °· Treatment for hypertension during pregnancy varies depending on the type of hypertension you have and how serious it is. °· Keep all prenatal and follow-up visits as told by your health care provider. This is important. °This information is not intended to replace advice given to you by your health care provider. Make sure you discuss any questions you have with your health care provider. °Document Released: 04/23/2011 Document Revised: 11/26/2018 Document Reviewed: 09/01/2018 °Elsevier Patient Education © 2020 [employer] ° °

## 2019-02-24 ENCOUNTER — Other Ambulatory Visit: Payer: Self-pay

## 2019-02-24 ENCOUNTER — Encounter (HOSPITAL_COMMUNITY): Payer: Self-pay | Admitting: *Deleted

## 2019-02-24 ENCOUNTER — Inpatient Hospital Stay (HOSPITAL_COMMUNITY)
Admission: AD | Admit: 2019-02-24 | Discharge: 2019-02-26 | DRG: 806 | Disposition: A | Discharge status: Home / Self Care | Payer: Medicaid Other | Attending: Obstetrics and Gynecology | Admitting: Obstetrics and Gynecology

## 2019-02-24 DIAGNOSIS — Z1159 Encounter for screening for other viral diseases: Secondary | ICD-10-CM

## 2019-02-24 DIAGNOSIS — O35EXX0 Maternal care for other (suspected) fetal abnormality and damage, fetal genitourinary anomalies, not applicable or unspecified: Secondary | ICD-10-CM | POA: Diagnosis present

## 2019-02-24 DIAGNOSIS — O0990 Supervision of high risk pregnancy, unspecified, unspecified trimester: Secondary | ICD-10-CM

## 2019-02-24 DIAGNOSIS — Z87891 Personal history of nicotine dependence: Secondary | ICD-10-CM | POA: Diagnosis not present

## 2019-02-24 DIAGNOSIS — Z3A39 39 weeks gestation of pregnancy: Secondary | ICD-10-CM

## 2019-02-24 DIAGNOSIS — O358XX0 Maternal care for other (suspected) fetal abnormality and damage, not applicable or unspecified: Secondary | ICD-10-CM | POA: Diagnosis present

## 2019-02-24 DIAGNOSIS — Z8759 Personal history of other complications of pregnancy, childbirth and the puerperium: Secondary | ICD-10-CM

## 2019-02-24 DIAGNOSIS — O09299 Supervision of pregnancy with other poor reproductive or obstetric history, unspecified trimester: Secondary | ICD-10-CM

## 2019-02-24 DIAGNOSIS — O26893 Other specified pregnancy related conditions, third trimester: Secondary | ICD-10-CM | POA: Diagnosis present

## 2019-02-24 LAB — COMPREHENSIVE METABOLIC PANEL
ALT: 17 U/L (ref 0–44)
AST: 21 U/L (ref 15–41)
Albumin: 3 g/dL — ABNORMAL LOW (ref 3.5–5.0)
Alkaline Phosphatase: 186 U/L — ABNORMAL HIGH (ref 38–126)
Anion gap: 10 (ref 5–15)
BUN: 12 mg/dL (ref 6–20)
CO2: 19 mmol/L — ABNORMAL LOW (ref 22–32)
Calcium: 8.9 mg/dL (ref 8.9–10.3)
Chloride: 107 mmol/L (ref 98–111)
Creatinine, Ser: 0.76 mg/dL (ref 0.44–1.00)
GFR calc Af Amer: >60 mL/min (ref >60)
GFR calc non Af Amer: >60 mL/min (ref >60)
Glucose, Bld: 86 mg/dL (ref 70–99)
Potassium: 4 mmol/L (ref 3.5–5.1)
Sodium: 136 mmol/L (ref 135–145)
Total Bilirubin: 0.7 mg/dL (ref 0.3–1.2)
Total Protein: 6.2 g/dL — ABNORMAL LOW (ref 6.5–8.1)

## 2019-02-24 LAB — CBC
HCT: 36.2 % (ref 36.0–46.0)
HCT: 28.8 % — ABNORMAL LOW (ref 36.0–46.0)
Hemoglobin: 12.1 g/dL (ref 12.0–15.0)
Hemoglobin: 9.7 g/dL — ABNORMAL LOW (ref 12.0–15.0)
MCH: 26.9 pg (ref 26.0–34.0)
MCH: 27 pg (ref 26.0–34.0)
MCHC: 33.4 g/dL (ref 30.0–36.0)
MCHC: 33.7 g/dL (ref 30.0–36.0)
MCV: 80.4 fL (ref 80.0–100.0)
MCV: 80.2 fL (ref 80.0–100.0)
Platelets: 196 10*3/uL (ref 150–400)
Platelets: 165 10*3/uL (ref 150–400)
RBC: 4.5 MIL/uL (ref 3.87–5.11)
RBC: 3.59 MIL/uL — ABNORMAL LOW (ref 3.87–5.11)
RDW: 15.5 % (ref 11.5–15.5)
RDW: 15.2 % (ref 11.5–15.5)
WBC: 10.4 10*3/uL (ref 4.0–10.5)
WBC: 13.2 10*3/uL — ABNORMAL HIGH (ref 4.0–10.5)
nRBC: 0 % (ref 0.0–0.2)
nRBC: 0 % (ref 0.0–0.2)

## 2019-02-24 LAB — TYPE AND SCREEN
ABO/RH(D): B POS
Antibody Screen: NEGATIVE

## 2019-02-24 LAB — SARS CORONAVIRUS 2 BY RT PCR (HOSPITAL ORDER, PERFORMED IN ~~LOC~~ HOSPITAL LAB): SARS Coronavirus 2: NEGATIVE

## 2019-02-24 MED ORDER — TRANEXAMIC ACID-NACL 1000-0.7 MG/100ML-% IV SOLN
1000.0000 mg | INTRAVENOUS | Status: AC
  Administered 2019-02-24: 1000 mg via INTRAVENOUS
  Filled 2019-02-24: qty 100

## 2019-02-24 MED ORDER — SODIUM CHLORIDE 0.9 % IV BOLUS
1000.0000 mL | Freq: Once | INTRAVENOUS | Status: AC
  Administered 2019-02-24: 1000 mL via INTRAVENOUS

## 2019-02-24 MED ORDER — PRENATAL MULTIVITAMIN CH
1.0000 | ORAL_TABLET | Freq: Every day | ORAL | Status: DC
  Administered 2019-02-25: 1 via ORAL
  Filled 2019-02-24: qty 1

## 2019-02-24 MED ORDER — OXYTOCIN 40 UNITS IN NORMAL SALINE INFUSION - SIMPLE MED
INTRAVENOUS | Status: AC
  Filled 2019-02-24: qty 1000

## 2019-02-24 MED ORDER — FLEET ENEMA 7-19 GM/118ML RE ENEM: 1.0000 | ENEMA | RECTAL | Status: DC | PRN

## 2019-02-24 MED ORDER — OXYCODONE-ACETAMINOPHEN 5-325 MG PO TABS: 1.0000 | ORAL_TABLET | ORAL | Status: DC | PRN

## 2019-02-24 MED ORDER — ONDANSETRON HCL 4 MG/2ML IJ SOLN: 4.0000 mg | Freq: Four times a day (QID) | INTRAMUSCULAR | Status: DC | PRN

## 2019-02-24 MED ORDER — LACTATED RINGERS IV SOLN: 500.0000 mL | INTRAVENOUS | Status: DC | PRN

## 2019-02-24 MED ORDER — ONDANSETRON HCL 4 MG/2ML IJ SOLN: 4.0000 mg | INTRAMUSCULAR | Status: DC | PRN

## 2019-02-24 MED ORDER — FENTANYL CITRATE (PF) 100 MCG/2ML IJ SOLN
100.0000 ug | Freq: Once | INTRAMUSCULAR | Status: AC
  Administered 2019-02-24: 100 ug via INTRAVENOUS

## 2019-02-24 MED ORDER — CEFAZOLIN SODIUM-DEXTROSE 2-4 GM/100ML-% IV SOLN
2.0000 g | Freq: Once | INTRAVENOUS | Status: AC
  Administered 2019-02-24: 2 g via INTRAVENOUS
  Filled 2019-02-24: qty 100

## 2019-02-24 MED ORDER — FENTANYL CITRATE (PF) 100 MCG/2ML IJ SOLN
INTRAMUSCULAR | Status: AC
  Administered 2019-02-24: 100 ug via INTRAVENOUS
  Filled 2019-02-24: qty 2

## 2019-02-24 MED ORDER — LACTATED RINGERS IV SOLN
INTRAVENOUS | Status: DC
  Administered 2019-02-24: 10:00:00 via INTRAVENOUS

## 2019-02-24 MED ORDER — TETANUS-DIPHTH-ACELL PERTUSSIS 5-2.5-18.5 LF-MCG/0.5 IM SUSP: 0.5000 mL | Freq: Once | INTRAMUSCULAR | Status: DC

## 2019-02-24 MED ORDER — DIPHENHYDRAMINE HCL 25 MG PO CAPS: 25.0000 mg | ORAL_CAPSULE | Freq: Four times a day (QID) | ORAL | Status: DC | PRN

## 2019-02-24 MED ORDER — FENTANYL CITRATE (PF) 100 MCG/2ML IJ SOLN
INTRAMUSCULAR | Status: AC
  Filled 2019-02-24: qty 2

## 2019-02-24 MED ORDER — ACETAMINOPHEN 325 MG PO TABS: 650.0000 mg | ORAL_TABLET | ORAL | Status: DC | PRN

## 2019-02-24 MED ORDER — COCONUT OIL OIL
1.0000 "application " | TOPICAL_OIL | Status: DC | PRN
  Administered 2019-02-25: 1 via TOPICAL

## 2019-02-24 MED ORDER — SIMETHICONE 80 MG PO CHEW: 80.0000 mg | CHEWABLE_TABLET | ORAL | Status: DC | PRN

## 2019-02-24 MED ORDER — SENNOSIDES-DOCUSATE SODIUM 8.6-50 MG PO TABS
2.0000 | ORAL_TABLET | ORAL | Status: DC
  Administered 2019-02-24 – 2019-02-26 (×2): 2 via ORAL
  Filled 2019-02-24 (×2): qty 2

## 2019-02-24 MED ORDER — FENTANYL CITRATE (PF) 100 MCG/2ML IJ SOLN: 100.0000 ug | Freq: Once | INTRAMUSCULAR | Status: DC

## 2019-02-24 MED ORDER — IBUPROFEN 600 MG PO TABS
600.0000 mg | ORAL_TABLET | Freq: Four times a day (QID) | ORAL | Status: DC
  Administered 2019-02-24 – 2019-02-26 (×8): 600 mg via ORAL
  Filled 2019-02-24 (×8): qty 1

## 2019-02-24 MED ORDER — OXYTOCIN BOLUS FROM INFUSION
500.0000 mL | Freq: Once | INTRAVENOUS | Status: AC
  Administered 2019-02-24: 500 mL via INTRAVENOUS

## 2019-02-24 MED ORDER — WITCH HAZEL-GLYCERIN EX PADS: 1.0000 "application " | MEDICATED_PAD | CUTANEOUS | Status: DC | PRN

## 2019-02-24 MED ORDER — OXYTOCIN 40 UNITS IN NORMAL SALINE INFUSION - SIMPLE MED
2.5000 [IU]/h | INTRAVENOUS | Status: DC
  Administered 2019-02-24: 2.5 [IU]/h via INTRAVENOUS

## 2019-02-24 MED ORDER — ZOLPIDEM TARTRATE 5 MG PO TABS: 5.0000 mg | ORAL_TABLET | Freq: Every evening | ORAL | Status: DC | PRN

## 2019-02-24 MED ORDER — DIBUCAINE (PERIANAL) 1 % EX OINT: 1.0000 "application " | TOPICAL_OINTMENT | CUTANEOUS | Status: DC | PRN

## 2019-02-24 MED ORDER — LIDOCAINE HCL (PF) 1 % IJ SOLN
30.0000 mL | INTRAMUSCULAR | Status: AC | PRN
  Administered 2019-02-24: 30 mL via SUBCUTANEOUS
  Filled 2019-02-24: qty 30

## 2019-02-24 MED ORDER — ONDANSETRON HCL 4 MG PO TABS: 4.0000 mg | ORAL_TABLET | ORAL | Status: DC | PRN

## 2019-02-24 MED ORDER — MISOPROSTOL 200 MCG PO TABS
800.0000 ug | ORAL_TABLET | Freq: Once | ORAL | Status: AC
  Administered 2019-02-24: 800 ug via ORAL
  Filled 2019-02-24: qty 4

## 2019-02-24 MED ORDER — OXYCODONE-ACETAMINOPHEN 5-325 MG PO TABS: 2.0000 | ORAL_TABLET | ORAL | Status: DC | PRN

## 2019-02-24 MED ORDER — SOD CITRATE-CITRIC ACID 500-334 MG/5ML PO SOLN: 30.0000 mL | ORAL | Status: DC | PRN

## 2019-02-24 MED ORDER — BENZOCAINE-MENTHOL 20-0.5 % EX AERO
1.0000 "application " | INHALATION_SPRAY | CUTANEOUS | Status: DC | PRN
  Administered 2019-02-24: 1 via TOPICAL
  Filled 2019-02-24: qty 56

## 2019-02-24 NOTE — Discharge Summary (Signed)
Obstetrics Discharge Summary [facility]N [facility]   Patient Name: Andrea Espinoza DOB: 04-27-1996 MRN: [id number]  Date of admission: 02/24/2019 Delivering MD: Gerlene Fee   Date of discharge: 02/26/2019  Admitting diagnosis: 3 MIN CTX Intrauterine pregnancy: [redacted]w[redacted]d     Secondary diagnosis:   Principal Problem:   Normal labor Active Problems:   History of pre-eclampsia   History of HELLP syndrome, currently pregnant   Pyelectasis of fetus on prenatal ultrasound   Indication for care in labor or delivery   Vaginal delivery   Postpartum hemorrhage   Discharge diagnosis: Term Pregnancy Delivered                                            Postpartum procedures: None  Complications: Delayed PPH - received TXA, cytotec, Ancef  Hospital course: Andrea Espinoza is a 23 y.o. [redacted]w[redacted]d who was admitted for spontaneous onset of labor. Her pregnancy was complicated by above note. Her labor course was notable for admission when complete, AROM just prior to delivery for clear fluid. Delivery was complicated by 1st degree perineal laceartion. Please see delivery/op note for additional details. She did have a delayed PPH of about 1L, was given cytotec and TXA with improvement in bleeding. Her HgB trended from 12.1 to 9.7 on PPD#1. Her postpartum course was uncomplicated. She was breastfeeding without difficulty. By day of discharge, she was passing flatus, urinating, eating and drinking without difficulty. Her pain was well-controlled, and she was discharged home with rx ibuprofen & colace. She will follow-up in clinic in 4 weeks.   Physical exam  Vitals:   02/24/19 2330 02/25/19 0521 02/26/19 0000 02/26/19 0533  BP: 129/74 128/86 115/78 127/89  Pulse: 93 74 93 75  Resp: 17 17 16 18   Temp: 98.6 F (37 C) 98 F (36.7 C) 98.6 F (37 C) 98.2 F (36.8 C)  TempSrc: Oral Oral Oral   SpO2:  100% 100%   Weight:      Height:       General: alert, cooperative Lochia: appropriate Uterine Fundus:  firm Incision: N/A DVT Evaluation: No evidence of DVT seen on physical exam. No significant calf/ankle edema.  Labs: Lab Results  Component Value Date   WBC 9.5 02/25/2019   HGB 8.1 (L) 02/25/2019   HCT 23.9 (L) 02/25/2019   MCV 81.0 02/25/2019   PLT 145 (L) 02/25/2019   CMP Latest Ref Rng & Units 02/24/2019  Glucose 70 - 99 mg/dL 86  BUN 6 - 20 mg/dL 12  Creatinine 0.44 - 1.00 mg/dL 0.76  Sodium 135 - 145 mmol/L 136  Potassium 3.5 - 5.1 mmol/L 4.0  Chloride 98 - 111 mmol/L 107  CO2 22 - 32 mmol/L 19(L)  Calcium 8.9 - 10.3 mg/dL 8.9  Total Protein 6.5 - 8.1 g/dL 6.2(L)  Total Bilirubin 0.3 - 1.2 mg/dL 0.7  Alkaline Phos 38 - 126 U/L 186(H)  AST 15 - 41 U/L 21  ALT 0 - 44 U/L 17    Discharge instructions: Per After Visit Summary and "Baby and Me Booklet"  After visit meds:  Allergies as of 02/26/2019   No Known Allergies     Medication List    STOP taking these medications   aspirin EC 81 MG tablet     TAKE these medications   calcium carbonate 500 MG chewable tablet Commonly known as: TUMS - dosed in mg elemental  calcium Chew 1 tablet by mouth daily as needed for indigestion or heartburn.   docusate sodium 100 MG capsule Commonly known as: Colace Take 1 capsule (100 mg total) by mouth 2 (two) times daily for 15 days.   ibuprofen 600 MG tablet Commonly known as: ADVIL Take 1 tablet (600 mg total) by mouth every 6 (six) hours.   PRENATAL VITAMINS PO Take 1 tablet by mouth daily.       Postpartum contraception: pt considering patch, [facility] Diet: Routine Diet Activity: Advance as tolerated. Pelvic rest for 6 weeks.   Follow-up Appt: Future Appointments  Date Time Provider Department Center  03/01/2019  8:15 AM [facility]  03/01/2019 10:15 AM Adam PhenixArnold, James G, MD [facility]   Follow-up Visit:No follow-ups on file.  Please schedule this patient for Postpartum visit in: 4 weeks with the following provider: Any  provider Low risk pregnancy complicated by: history of preE Delivery mode:  SVD Anticipated Birth Control:  patch PP Procedures needed: none  Schedule Integrated BH visit: no  Newborn Data: Live born female  Birth Weight: 7 lb 8.8 oz (3425 g) APGAR: 9, 9  Newborn Delivery   Birth date/time: 02/24/2019 10:36:00 Delivery type: Vaginal, Spontaneous     Baby Feeding: breast & bottle Disposition:home with mother      Judeth HornLawrence, Shamarr Faucett, NP

## 2019-02-24 NOTE — MAU Note (Signed)
Pt reports contractions since 4am. Denies any LOF or vaginal bleeding

## 2019-02-24 NOTE — H&P (Signed)
OBSTETRIC ADMISSION HISTORY AND PHYSICAL  Andrea Espinoza is a 23 y.o. female [phone] with IUP at 54w6dby L/13 presenting for SOL. Met patient in MAU - contracting every 2-3 minutes. Still in tact. Very uncomfortable. Labor started around 0400.   Reports fetal movement. Denies vaginal bleeding, leakage of fluids.   [facility]: Husband  Ultrasounds . 13w5: viability U/S, unable to obtain NT because of fetal position . 22w4: EFW 584g, 62%, bilateral mild urinary tract dilations . 30w4: EFW 1750g, 69%, pyelectasis resolved  Prenatal History/Complications: . History of preterm birth  . History of severe preeclampsia, HELLP  . Resolved fetal pyelectasis  Past Medical History: Past Medical History:  Diagnosis Date  . Bipolar affective disorder ([place]   . Complication of anesthesia   . Mental disorder    bipolar, PTSD  . Preeclampsia   . PTSD (post-traumatic stress disorder)     Past Surgical History: Past Surgical History:  Procedure Laterality Date  . WISDOM TOOTH EXTRACTION      Obstetrical History: OB History    Gravida  3   Para  1   Term  0   Preterm  1   AB  1   Living  1     SAB  0   TAB  1   Ectopic  0   Multiple  0   Live Births  1           Social History: Social History   Socioeconomic History  . Marital status: Single    Spouse name: Not on file  . Number of children: Not on file  . Years of education: Not on file  . Highest education level: Not on file  Occupational History  . Not on file  Social Needs  . Financial resource strain: Not on file  . Food insecurity    Worry: Never true    Inability: Never true  . Transportation needs    Medical: No    Non-medical: No  Tobacco Use  . Smoking status: Former Smoker    Types: Cigarettes    Quit date: 09/11/2015    Years since quitting: 3.4  . Smokeless tobacco: Never Used  Substance and Sexual Activity   . Alcohol use: No  . Drug use: No    Types: Marijuana  . Sexual activity: Yes    Birth control/protection: None  Lifestyle  . Physical activity    Days per week: Not on file    Minutes per session: Not on file  . Stress: Not on file  Relationships  . Social [place] phone: Not on file    Gets together: Not on file    Attends religious service: Not on file    Active member of club or organization: Not on file    Attends meetings of clubs or organizations: Not on file    Relationship status: Not on file  Other Topics Concern  . Not on file  Social History Narrative  . Not on file    Family History: Family History  Problem Relation Age of Onset  . Diabetes Maternal Grandmother     Allergies: No Known Allergies  Medications Prior to Admission  Medication Sig Dispense Refill Last Dose  . Prenatal Multivit-Min-Fe-FA (PRENATAL VITAMINS PO) Take by mouth.        Review of Systems  All systems reviewed and negative except as stated in HPI  Blood pressure (!) 120/102, pulse 74, temperature 98.3 F (36.8 C), temperature source Oral, resp. rate 18, height '5\' 6"'  (1.676 m), weight 104.3 kg, last menstrual period 05/21/2018, unknown if currently breastfeeding. General appearance: uncomfortable appearing Lungs: no respiratory distress Heart: regular rate  Abdomen: soft, non-tender; gravid  Pelvic: deferred Extremities: no significant LE edema Presentation: cephalic by BSUS, SVE Fetal monitoring: no continuous tracing but FHR in 140s in MAU Uterine activity: every 2-3 minutes Dilation: 10 Effacement (%): 100 Station: -2 Exam by:: confimed by U/S  Prenatal labs: ABO, Rh: --/--/B POS (07/08 1013) Antibody: NEG (07/08 1013) Rubella:  pending RPR: Non Reactive (04/22 0849)  HBsAg: Negative (12/16 0000)  HIV: Non Reactive (04/22 0849)  GBS:   negative Glucola: normal 2-hr Genetic screening:  normal  Prenatal Transfer Tool  Maternal Diabetes:  No Genetic Screening: Normal Maternal Ultrasounds/Referrals: Other:resolved fetal pyelectasis Fetal Ultrasounds or other Referrals:  None Maternal Substance Abuse:  No Significant Maternal Medications:  PNV, ASA Significant Maternal Lab Results: Group B Strep negative  Results for orders placed or performed during the hospital encounter of 02/24/19 (from the past 24 hour(s))  Type and screen [place]   Collection Time: 02/24/19 10:13 AM  Result Value Ref Range   ABO/RH(D) B POS    Antibody Screen NEG    Sample Expiration      02/27/2019,2359 Performed at [facility], [place] [address] [place], [place] [address]     Patient Active Problem List   Diagnosis Date Noted  . Indication for care in labor or delivery 02/24/2019  . Normal labor 02/24/2019  . Pyelectasis of fetus on prenatal ultrasound 12/09/2018  . Supervision of high risk pregnancy, antepartum 08/18/2018  . History of pre-eclampsia 08/18/2018  . History of HELLP syndrome, currently pregnant 08/18/2018    Assessment/Plan:  Andrea Espinoza is a 23 y.o. [id number] at 38w6dhere for SOL.   Labor: SOL, complete in MAU. Will transfer to L&D. Expectant management. Anticipate SVD.  -- pain control: no medications  Elevated BP Intrapartum  History of Preeclampsia: Moderate range pressures, asymptomatic.  -- will check CBC, CMP and continue to monitor closely   Fetal Wellbeing: EFW 7lbs by Leopold's. Cephalic by sutures and BSUS.  -- GBS (negative) -- continuous fetal monitoring - category I   Postpartum Planning -- breast/patch -- RI/'[x]' Tdap   Andrea Yuan S. Andrea China DO OB/GYN Fellow

## 2019-02-24 NOTE — Progress Notes (Signed)
Called to room to evaluate postpartum bleeding.  QBL at that time just over 1000 ml.  Patient delivered precipitously a couple of hours ago. First degree perineal laceration, delivering provider says low concern for retained products. Patient denies hx pph. Denies palpitations or lightheadedness. No ha or vision change.  Per RN, when went for fundal check moderate clot seen in pad, and when ambulated to use bathroom sig clot/blood left in toilet.  On exam repaired first degree perineal laceration appears hemostatic. Vitals wnl. Vitals:   02/24/19 1232 02/24/19 1332  BP: (!) 142/94 122/76  Pulse: 69   Resp: 12 18  Temp: 99.3 F (37.4 C) 98.9 F (37.2 C)  SpO2: 100% 100%     100 mcg fentanyl given. Foley catheter placed. TXA and ancef and cycotec 800 mcg oral ordered. Uterine sweep performed, mild/mod amount of clot removed that was distributed throughout the uterine cavity. Bleeding after sweep minimal. QBL now 1332 ml.  Plan: q 15 min vitals and fundal massage, monitor for worsening. Will check cbc in a few hours. Presented w/ mildly elevated bp and hx preE in previous pregnancy. Most recent bp wnl and hellp panel negative a few hours ago, so at this time low concern for hellp.  2 hr f/u: fundus firm, per RN bleeding wnl, patient asymptomatic.

## 2019-02-25 LAB — CBC
HCT: 23.9 % — ABNORMAL LOW (ref 36.0–46.0)
Hemoglobin: 8.1 g/dL — ABNORMAL LOW (ref 12.0–15.0)
MCH: 27.5 pg (ref 26.0–34.0)
MCHC: 33.9 g/dL (ref 30.0–36.0)
MCV: 81 fL (ref 80.0–100.0)
Platelets: 145 10*3/uL — ABNORMAL LOW (ref 150–400)
RBC: 2.95 MIL/uL — ABNORMAL LOW (ref 3.87–5.11)
RDW: 15.4 % (ref 11.5–15.5)
WBC: 9.5 10*3/uL (ref 4.0–10.5)
nRBC: 0 % (ref 0.0–0.2)

## 2019-02-25 LAB — RPR: RPR Ser Ql: NONREACTIVE

## 2019-02-25 LAB — ABO/RH: ABO/RH(D): B POS

## 2019-02-25 LAB — RUBELLA SCREEN: Rubella: 3.68 index (ref >0.99)

## 2019-02-25 NOTE — Clinical Social Work Maternal (Signed)
CLINICAL SOCIAL WORK MATERNAL/CHILD NOTE  Patient Details  Name: Andrea Espinoza MRN: [id number] Date of Birth: September 20, 1995  Date:  02/25/2019  Clinical Social Worker Initiating Note:  Elijio Miles Date/Time: Initiated:  02/25/19/0902     Child's Name:  Andrea Espinoza   Biological Parents:  Mother, Father(Andrea Espinoza and Andrea Espinoza DOB: 04/24/1961)   Need for Interpreter:  None   Reason for Referral:  Behavioral Health Concerns   Address:  [place] [address]    Phone number:  [phone] (home)     Additional phone number:   Household Members/Support Persons (HM/SP):   Household Member/Support Person 1, Household Member/Support Person 2   HM/SP Name Relationship DOB or Age  HM/SP -1 Andrea Espinoza Son 03/27/2009  HM/SP -2 Andrea Espinoza Daughter 05/15/2016  HM/SP -3        HM/SP -4        HM/SP -5        HM/SP -6        HM/SP -7        HM/SP -8          Natural Supports (not living in the home):  Friends, Extended Family(FOB, aunt and friends)   Chiropodist: None   Employment: Unemployed   Type of Work:     Education:  Programmer, systems   Homebound arranged:    Museum/gallery curator Resources:  Medicaid   Other Resources:  Physicist, medical , [place] Considerations Which May Impact Care:    Strengths:  Ability to meet basic needs , Home prepared for child , Pediatrician chosen   Psychotropic Medications:         Pediatrician:    [place]  Pediatrician List:   [facility] for [place]      Pediatrician Fax Number:    Risk Factors/Current Problems:  None(Consult for bipolar and PTSD, but MOB denies any mental health history)   Cognitive State:  Able to Concentrate , Alert , Linear Thinking    Mood/Affect:  Bright , Calm , Comfortable , Relaxed , Interested    CSW Assessment:  CSW  received consult for history of bipolar and PTSD.  CSW met with MOB to offer support and complete assessment.    MOB resting in bed eating breakfast with infant asleep in basinet and FOB resting on couch, when CSW entered the room. CSW introduced self and received verbal permission from MOB to complete assessment with FOB present. CSW explained reason for consult and MOB expressed understanding. FOB did not speak during assessment and walked out midway to go to birth registry. MOB pleasant and easy to engage and was appropriate and attentive to infant throughout visit. MOB reported she currently lives with her two children. MOB explained that the oldest child is not her biological son but that she is his guardian. MOB reported that both are excited to meet infant. CSW inquired about MOB's mental health history to which MOB denied having any. CSW addressed noted bipolar and PTSD but MOB shook her head no. CSW inquired about if MOB experienced any PPD with her first child and MOB acknowledged experiencing some moodiness the first two weeks but was able "to get herself together". CSW provided education regarding the baby blues period, which is what MOB was likely experiencing, vs. perinatal mood disorders. CSW discussed treatment  and gave resources for mental health follow up if concerns arise.  CSW recommended self-evaluation during the postpartum time period using the New Mom Checklist from Postpartum Progress and encouraged MOB to contact a medical professional if symptoms are noted at any time.  MOB denied any current SI or HI and reported having a good support system consisting of FOB, her aunt and some friends.  MOB reported having all essential items for infant once discharged and stated infant would be sleeping in basinet once home. MOB already aware of SIDS precautions that were reviewed by CSW. MOB denied any further questions, concerns or need for resources from CSW, at this time.  CSW  Plan/Description:  No Further Intervention Required/No Barriers to Discharge, Sudden Infant Death Syndrome (SIDS) Education, Perinatal Mood and Anxiety Disorder (PMADs) Education    [place], [place] 02/25/2019, 10:44 AM

## 2019-02-25 NOTE — Lactation Note (Signed)
This note was copied from a baby's chart. Lactation Consultation Note  Patient Name: Andrea Espinoza Date: 02/25/2019 Reason for consult: Initial assessment;Term P2, 12 hour female infant,infant had high weight loss -6% Mom with hx of PTSD and Bi-polar but not on meds. Per mom, infant had 3 voids and 4 stools since birth. Mom receives [facility] in [place]. Infant was at high risk zone TcB at 7.2 at 11 am today. Per mom, infant sibling was jaundice at birth and was on bili lights. Mom's feeding choice at admission was breastfeeding, mom's current feeding choice is breast and bottle feeding. Per mom, she is now breastfeeding infant first and then supplementing with formula afterwards. Per mom, she pre-pumps with hand pump prior to latching infant due nipple inversion and infant is now latching well to breast. LC did not observed latch at this time,  due to mom breastfeeding for 20 minutes and the supplementing infant with 28 ml of gerber with iron at 9 pm. Per mom, infant is now cluster feeding and she is breastfeeding on demand when infant cuing, mom knows to breastfeed infant 8 to 12 times within 24 hours. Mom has been doing a lot of STS with infant.  LC discussed I & O. Reviewed Baby & Me book's Breastfeeding Basics.  Mom made aware of O/P services, breastfeeding support groups, community resources, and our phone # for post-discharge questions.   Maternal Data Formula Feeding for Exclusion: No Has patient been taught Hand Expression?: Yes Does the patient have breastfeeding experience prior to this delivery?: Yes  Feeding Feeding Type: Breast Fed Nipple Type: Slow - flow  LATCH Score Latch: (encouraged mom to call out to observe latch)  Audible Swallowing: A few with stimulation  Type of Nipple: Everted at rest and after stimulation  Comfort (Breast/Nipple): Filling, red/small blisters or bruises, mild/mod discomfort  Hold (Positioning): No assistance needed to  correctly position infant at breast.  LATCH Score: 8  Interventions Interventions: Breast feeding basics reviewed;Position options;Hand pump;Pre-pump if needed;Hand express  Lactation Tools Discussed/Used WIC Program: Yes Pump Review: Setup, frequency, and cleaning;Milk Storage Initiated by:: by nurse   Consult Status Consult Status: Follow-up Date: 02/26/19 Follow-up type: In-patient    Vicente Serene 02/25/2019, 10:10 PM

## 2019-02-25 NOTE — Progress Notes (Signed)
Post Partum Day #1 Subjective: no complaints, up ad lib, voiding, tolerating PO, + flatus and lochia normal now, no excess clots or increased bleeding, lochia similar to less than a "period". No pain.  Objective: Blood pressure 128/86, pulse 74, temperature 98 F (36.7 C), temperature source Oral, resp. rate 17, height 5\' 6"  (1.676 m), weight 104.3 kg, last menstrual period 05/21/2018, SpO2 100 %, unknown if currently breastfeeding.  Physical Exam:  General: alert, cooperative, appears stated age and no distress Lochia: appropriate Uterine Fundus: firm Incision: N/A DVT Evaluation: No evidence of DVT seen on physical exam. Negative Homan's sign.  Recent Labs    02/24/19 1802 02/25/19 0557  HGB 9.7* 8.1*  HCT 28.8* 23.9*    Assessment/Plan: Plan for discharge tomorrow, Breastfeeding and Contraception patch If patient does well today, continued appropriate lochia, vitals remain stable, possible discharge home this afternoon.    LOS: 1 day   Andrea Espinoza, [place] for [employer], [place] [employer] 02/25/2019  10:21 AM

## 2019-02-26 MED ORDER — DOCUSATE SODIUM 100 MG PO CAPS: 100.0000 mg | ORAL_CAPSULE | Freq: Two times a day (BID) | ORAL | 0 refills | Status: AC

## 2019-02-26 MED ORDER — IBUPROFEN 600 MG PO TABS: 600.0000 mg | ORAL_TABLET | Freq: Four times a day (QID) | ORAL | 0 refills | Status: DC

## 2019-02-26 NOTE — Lactation Note (Signed)
This note was copied from a baby's chart. Lactation Consultation Note  Patient Name: Andrea Espinoza [id number] Date: 02/26/2019 Reason for consult: Follow-up assessment;Term;Infant weight loss  P2 mother whose infant is now 52 hours old. Baby has an 8% weight loss. Mother breast fed her first child (now 21 years old) for 3 months.  Mother was breast feeding when I arrived.  Her breasts are soft and non tender and nipples are intact.  Left nipple is partially inverted and mother has been using the manual pump to help evert.  She has not had difficulty latching baby on that breast.  Mother hears occasional swallows and feels like her breasts are starting to feel more full today.  Engorgement prevention/treatment discussed.  Mother remembered being engorged with her first child.  Informed mother of baby's 8% weight loss today and encouraged her to continue breast feeding 8-12 times/24 hours or sooner if baby shows feeding cues.  She is supplementing with formula.    Mother may be interested in obtaining a DEBP from [facility] after discharge.  She will take her manual pump for home use.  Answered a few breast feeding questions and mother is ready for discharge.  She has our OP phone number for questions/concerns after discharge.    Maternal Data Formula Feeding for Exclusion: Yes Reason for exclusion: Mother's choice to formula and breast feed on admission Has patient been taught Hand Expression?: Yes Does the patient have breastfeeding experience prior to this delivery?: Yes  Feeding Feeding Type: Breast Fed  LATCH Score Latch: Grasps breast easily, tongue down, lips flanged, rhythmical sucking.  Audible Swallowing: A few with stimulation  Type of Nipple: Everted at rest and after stimulation  Comfort (Breast/Nipple): Soft / non-tender  Hold (Positioning): Assistance needed to correctly position infant at breast and maintain latch.  LATCH Score: 8  Interventions Interventions: Breast  feeding basics reviewed;Skin to skin;Pre-pump if needed;Hand express;Breast compression;Hand pump  Lactation Tools Discussed/Used WIC Program: Yes   Consult Status Consult Status: Complete Date: 02/26/19 Follow-up type: In-patient    Little Ishikawa 02/26/2019, 8:49 AM

## 2019-02-26 NOTE — Discharge Instructions (Signed)
Postpartum Care After Vaginal Delivery °This sheet gives you information about how to care for yourself from the time you deliver your baby to up to 6-12 weeks after delivery (postpartum period). Your health care provider may also give you more specific instructions. If you have problems or questions, contact your health care provider. °Follow these instructions at home: °Vaginal bleeding °· It is normal to have vaginal bleeding (lochia) after delivery. Wear a sanitary pad for vaginal bleeding and discharge. °? During the first week after delivery, the amount and appearance of lochia is often similar to a menstrual period. °? Over the next few weeks, it will gradually decrease to a dry, yellow-brown discharge. °? For most women, lochia stops completely by 4-6 weeks after delivery. Vaginal bleeding can vary from woman to woman. °· Change your sanitary pads frequently. Watch for any changes in your flow, such as: °? A sudden increase in volume. °? A change in color. °? Large blood clots. °· If you pass a blood clot from your vagina, save it and call your health care provider to discuss. Do not flush blood clots down the toilet before talking with your health care provider. °· Do not use tampons or douches until your health care provider says this is safe. °· If you are not breastfeeding, your period should return 6-8 weeks after delivery. If you are feeding your child breast milk only (exclusive breastfeeding), your period may not return until you stop breastfeeding. °Perineal care °· Keep the area between the vagina and the anus (perineum) clean and dry as told by your health care provider. Use medicated pads and pain-relieving sprays and creams as directed. °· If you had a cut in the perineum (episiotomy) or a tear in the vagina, check the area for signs of infection until you are healed. Check for: °? More redness, swelling, or pain. °? Fluid or blood coming from the cut or tear. °? Warmth. °? Pus or a bad  smell. °· You may be given a squirt bottle to use instead of wiping to clean the perineum area after you go to the bathroom. As you start healing, you may use the squirt bottle before wiping yourself. Make sure to wipe gently. °· To relieve pain caused by an episiotomy, a tear in the vagina, or swollen veins in the anus (hemorrhoids), try taking a warm sitz bath 2-3 times a day. A sitz bath is a warm water bath that is taken while you are sitting down. The water should only come up to your hips and should cover your buttocks. °Breast care °· Within the first few days after delivery, your breasts may feel heavy, full, and uncomfortable (breast engorgement). Milk may also leak from your breasts. Your health care provider can suggest ways to help relieve the discomfort. Breast engorgement should go away within a few days. °· If you are breastfeeding: °? Wear a bra that supports your breasts and fits you well. °? Keep your nipples clean and dry. Apply creams and ointments as told by your health care provider. °? You may need to use breast pads to absorb milk that leaks from your breasts. °? You may have uterine contractions every time you breastfeed for up to several weeks after delivery. Uterine contractions help your uterus return to its normal size. °? If you have any problems with breastfeeding, work with your health care provider or lactation consultant. °· If you are not breastfeeding: °? Avoid touching your breasts a lot. Doing this can make   your breasts produce more milk. °? Wear a good-fitting bra and use cold packs to help with swelling. °? Do not squeeze out (express) milk. This causes you to make more milk. °Intimacy and sexuality °· Ask your health care provider when you can engage in sexual activity. This may depend on: °? Your risk of infection. °? How fast you are healing. °? Your comfort and desire to engage in sexual activity. °· You are able to get pregnant after delivery, even if you have not had  your period. If desired, talk with your health care provider about methods of birth control (contraception). °Medicines °· Take over-the-counter and prescription medicines only as told by your health care provider. °· If you were prescribed an antibiotic medicine, take it as told by your health care provider. Do not stop taking the antibiotic even if you start to feel better. °Activity °· Gradually return to your normal activities as told by your health care provider. Ask your health care provider what activities are safe for you. °· Rest as much as possible. Try to rest or take a nap while your baby is sleeping. °Eating and drinking ° °· Drink enough fluid to keep your urine pale yellow. °· Eat high-fiber foods every day. These may help prevent or relieve constipation. High-fiber foods include: °? Whole grain cereals and breads. °? Brown rice. °? Beans. °? Fresh fruits and vegetables. °· Do not try to lose weight quickly by cutting back on calories. °· Take your prenatal vitamins until your postpartum checkup or until your health care provider tells you it is okay to stop. °Lifestyle °· Do not use any products that contain nicotine or tobacco, such as cigarettes and e-cigarettes. If you need help quitting, ask your health care provider. °· Do not drink alcohol, especially if you are breastfeeding. °General instructions °· Keep all follow-up visits for you and your baby as told by your health care provider. Most women visit their health care provider for a postpartum checkup within the first 3-6 weeks after delivery. °Contact a health care provider if: °· You feel unable to cope with the changes that your child brings to your life, and these feelings do not go away. °· You feel unusually sad or worried. °· Your breasts become red, painful, or hard. °· You have a fever. °· You have trouble holding urine or keeping urine from leaking. °· You have little or no interest in activities you used to enjoy. °· You have not  breastfed at all and you have not had a menstrual period for 12 weeks after delivery. °· You have stopped breastfeeding and you have not had a menstrual period for 12 weeks after you stopped breastfeeding. °· You have questions about caring for yourself or your baby. °· You pass a blood clot from your vagina. °Get help right away if: °· You have chest pain. °· You have difficulty breathing. °· You have sudden, severe leg pain. °· You have severe pain or cramping in your lower abdomen. °· You bleed from your vagina so much that you fill more than one sanitary pad in one hour. Bleeding should not be heavier than your heaviest period. °· You develop a severe headache. °· You faint. °· You have blurred vision or spots in your vision. °· You have bad-smelling vaginal discharge. °· You have thoughts about hurting yourself or your baby. °If you ever feel like you may hurt yourself or others, or have thoughts about taking your own life, get help   right away. You can go to the nearest emergency department or call:  Your local emergency services (911 in the [place]  A suicide crisis helpline, such as the [facility] at 254-809-0690. This is open 24 hours a day. Summary  The period of time right after you deliver your newborn up to 6-12 weeks after delivery is called the postpartum period.  Gradually return to your normal activities as told by your health care provider.  Keep all follow-up visits for you and your baby as told by your health care provider. This information is not intended to replace advice given to you by your health care provider. Make sure you discuss any questions you have with your health care provider. Document Released: 06/02/2007 Document Revised: 08/08/2017 Document Reviewed: 05/19/2017 Elsevier Patient Education  2020 [employer]. Hypertension During Pregnancy High blood pressure (hypertension) is when the force of blood pumping through the arteries is too  strong. Arteries are blood vessels that carry blood from the heart throughout the body. Hypertension during pregnancy can be mild or severe. Severe hypertension during pregnancy (preeclampsia) is a medical emergency that requires prompt evaluation and treatment. Different types of hypertension can happen during pregnancy. These include:  Chronic hypertension. This happens when you had high blood pressure before you became pregnant, and it continues during the pregnancy. Hypertension that develops before you are [redacted] weeks pregnant and continues during the pregnancy is also called chronic hypertension. If you have chronic hypertension, it will not go away after you have your baby. You will need follow-up visits with your health care provider after you have your baby. Your doctor may want you to keep taking medicine for your blood pressure.  Gestational hypertension. This is hypertension that develops after the 20th week of pregnancy. Gestational hypertension usually goes away after you have your baby, but your health care provider will need to monitor your blood pressure to make sure that it is getting better.  Preeclampsia. This is severe hypertension during pregnancy. This can cause serious complications for you and your baby and can also cause complications for you after the delivery of your baby.  Postpartum preeclampsia. You may develop severe hypertension after giving birth. This usually occurs within 48 hours after childbirth but may occur up to 6 weeks after giving birth. This is rare. How does this affect me? Women who have hypertension during pregnancy have a greater chance of developing hypertension later in life or during future pregnancies. In some cases, hypertension during pregnancy can cause serious complications, such as:  Stroke.  Heart attack.  Injury to other organs, such as kidneys, lungs, or liver.  Preeclampsia.  Convulsions or seizures.  Placental abruption. How does this  affect my baby? Hypertension during pregnancy can affect your baby. Your baby may:  Be born early (prematurely).  Not weigh as much as he or she should at birth (low birth weight).  Not tolerate labor well, leading to an unplanned cesarean delivery. What are the risks? There are certain factors that make it more likely for you to develop hypertension during pregnancy. These include:  Having hypertension during a previous pregnancy.  Being overweight.  Being age 63 or older.  Being pregnant for the first time.  Being pregnant with more than one baby.  Becoming pregnant using fertilization methods, such as IVF (in vitro fertilization).  Having other medical problems, such as diabetes, kidney disease, or lupus.  Having a family history of hypertension. What can I do to lower my risk?  The exact cause of hypertension during pregnancy is not known. You may be able to lower your risk by:  Maintaining a healthy weight.  Eating a healthy and balanced diet.  Following your health care provider's instructions about treating any long-term conditions that you had before becoming pregnant. It is very important to keep all of your prenatal care appointments. Your health care provider will check your blood pressure and make sure that your pregnancy is progressing as expected. If a problem is found, early treatment can prevent complications. How is this treated? Treatment for hypertension during pregnancy varies depending on the type of hypertension you have and how serious it is.  If you were taking medicine for high blood pressure before you became pregnant, talk with your health care provider. You may need to change medicine during pregnancy because some medicines, like ACE inhibitors, may not be considered safe for your baby.  If you have gestational hypertension, your health care provider may order medicine to treat this during pregnancy.  If you are at risk for preeclampsia, your  health care provider may recommend that you take a low-dose aspirin during your pregnancy.  If you have severe hypertension, you may need to be hospitalized so you and your baby can be monitored closely. You may also need to be given medicine to lower your blood pressure. This medicine may be given by mouth or through an IV.  In some cases, if your condition gets worse, you may need to deliver your baby early. Follow these instructions at home: Eating and drinking   Drink enough fluid to keep your urine pale yellow.  Avoid caffeine. Lifestyle  Do not use any products that contain nicotine or tobacco, such as cigarettes, e-cigarettes, and chewing tobacco. If you need help quitting, ask your health care provider.  Do not use alcohol or drugs.  Avoid stress as much as possible.  Rest and get plenty of sleep.  Regular exercise can help to reduce your blood pressure. Ask your health care provider what kinds of exercise are best for you. General instructions  Take over-the-counter and prescription medicines only as told by your health care provider.  Keep all prenatal and follow-up visits as told by your health care provider. This is important. Contact a health care provider if:  You have symptoms that your health care provider told you may require more treatment or monitoring, such as: ? Headaches. ? Nausea or vomiting. ? Abdominal pain. ? Dizziness. ? Light-headedness. Get help right away if:  You have: ? Severe abdominal pain that does not get better with treatment. ? A severe headache that does not get better. ? Vomiting that does not get better. ? Sudden, rapid weight gain. ? Sudden swelling in your hands, ankles, or face. ? Vaginal bleeding. ? Blood in your urine. ? Blurred or double vision. ? Shortness of breath or chest pain. ? Weakness on one side of your body. ? Difficulty speaking.  Your baby is not moving as much as usual. Summary  High blood pressure  (hypertension) is when the force of blood pumping through the arteries is too strong.  Hypertension during pregnancy can cause problems for you and your baby.  Treatment for hypertension during pregnancy varies depending on the type of hypertension you have and how serious it is.  Keep all prenatal and follow-up visits as told by your health care provider. This is important. This information is not intended to replace advice given to you by your health care provider.  Make sure you discuss any questions you have with your health care provider. Document Released: 04/23/2011 Document Revised: 11/26/2018 Document Reviewed: 09/01/2018 Elsevier Patient Education  2020 [employer]     Care of a Perineal Tear A perineal tear is a cut or tear (laceration) in the tissue between the opening of the vagina and the anus (perineum). Some women develop a perineal tear during a vaginal birth. This can happen as the baby emerges from the birth canal and the perineum is stretched. There are four degrees of perineal tears based on how deep and long the laceration is:  First degree. This involves a shallow tear at the edge of the vaginal opening that extends slightly into the perineal skin.  Second degree. This involves tearing described in first degree perineal tear, and an additional deeper tear of the vaginal opening and perineal tissues. It may also include tearing of a muscle just under the perineal skin.  Third degree. This involves tearing described in first and second degree perineal tears, with the addition that tearing in the third degree extends into the muscle of the anus (anal sphincter).  Fourth degree. This involves all levels of tears described in first, second, and third degree perineal tears, with the tear in the fourth degree extending into the rectum. First and second degree perineal tears may or may not be stitched closed, depending on their location and appearance. Third and fourth degree  perineal tears are stitched closed immediately after the babys birth. What are the risks? Depending on the type of perineal tear you have, you may be at risk for:  Bleeding.  Developing a collection of blood in the perineal tear area (hematoma).  Pain. This may include pain when you urinate, or pain when you have a bowel movement.  Infection at the site of the tear.  Fever.  Trouble controlling your urination or bowels (incontinence).  Painful sex. How to care for a perineal tear Wound care  Take a sitz bath as told by your health care provider. A sitz bath is a warm water bath that is taken while you are sitting down. The water should only come up to your hips and should cover your buttocks. This can speed up healing. ? Partially fill a bathtub with warm water. You will only need the water to be deep enough to cover your hips and buttocks when you are sitting in it. ? If your health care provider told you to put medicine in the water, follow the directions exactly as told. ? Sit in the water and open the tub drain a little. ? Turn on the warm water again to keep the tub at the correct level. Keep the water running constantly. ? Soak in the water for 15-20 minutes or as told by your health care provider. ? After the sitz bath, pat the affected area dry first. Do not rub it. ? Be careful when you stand up after the sitz bath because you may feel dizzy.  Wash your hands before and after applying medicine to the area.  Wear a sanitary pad as told by your health care provider. Change the pad as often as told by your health care provider.  Leave stitches (sutures), skin glue, or adhesive strips in place. These skin closures may need to stay in place for 2 weeks or longer. If adhesive strip edges start to loosen and curl up, you may trim the loose edges. Do not remove adhesive strips completely unless your health care provider  tells you to do that.  Check your wound every day for signs  of infection. Check for: ? Redness, swelling, or pain. ? Fluid or blood. ? Warmth. ? Pus or a bad smell. Managing pain  If directed, put ice on the painful area: ? Put ice in a plastic bag. ? Place a towel between your skin and the bag. ? Leave the ice on for 20 minutes, 2-3 times a day.  Apply a numbing spray to the perineal tear site as told by your health care provider. This may help with discomfort.  Take and apply over-the-counter and prescription medicines only as told by your health care provider.  If told, put about 3 witch hazel-containing hemorrhoid treatment pads on top of your sanitary pad. The witch hazel in the hemorrhoid pads helps with swelling and discomfort.  Sit on an inflatable ring or pillow. This may provide comfort. General instructions  Squeeze warm water on your perineum after urinating. This should be done from front to back with a squeeze bottle. Pat the area to dry it.  Do not have sex, use tampons, or place anything in your vagina for at least 6 weeks or as told by your health care provider.  Keep all follow-up visits as told by your health care provider. These include any postpartum visits. This is important. Contact a health care provider if:  Your pain is not relieved with medicines.  You have painful urination.  You have redness, swelling, or pain around your tear.  You have fluid or blood coming from your tear.  Your tear feels warm to the touch.  You have pus or a bad smell coming from your tear.  You have a fever. Get help right away if:  Your tear opens.  You cannot urinate.  You have an increase in bleeding.  You have severe pain. Summary  A perineal tear is a cut or tear (laceration) in the tissue between the opening of the vagina and the anus (perineum).  There are four degrees of perineal tears based on how deep and long the laceration is.  First and second-degree perineal tears may or may not be stitched closed,  depending on their location and appearance. Third and fourth- degree perineal tears are stitched closed immediately after the babys birth.  Follow your health care provider's instructions for caring for your perineal tear. Know how to manage pain and how to care for your wound. Know when to call your health care provider and when to seek immediate emergency care. This information is not intended to replace advice given to you by your health care provider. Make sure you discuss any questions you have with your health care provider. Document Released: 12/20/2013 Document Revised: 07/18/2017 Document Reviewed: 09/09/2016 Elsevier Patient Education  2020 [employer].

## 2019-03-01 ENCOUNTER — Other Ambulatory Visit: Payer: Medicaid Other

## 2019-03-01 ENCOUNTER — Encounter: Payer: Medicaid Other | Admitting: Obstetrics & Gynecology

## 2019-03-24 ENCOUNTER — Other Ambulatory Visit: Payer: Self-pay

## 2019-03-24 ENCOUNTER — Encounter: Payer: Self-pay | Admitting: Obstetrics and Gynecology

## 2019-03-24 ENCOUNTER — Telehealth (INDEPENDENT_AMBULATORY_CARE_PROVIDER_SITE_OTHER): Payer: Medicaid Other | Admitting: Obstetrics and Gynecology

## 2019-03-24 MED ORDER — NORETHINDRONE 0.35 MG PO TABS: 1.0000 | ORAL_TABLET | Freq: Every day | ORAL | 11 refills | Status: DC

## 2019-03-24 NOTE — Progress Notes (Signed)
4:22p- Called Pt for for My Chart visit, no answer, left VM will call back in 10 minutes.  Pt answered on 2nd attempt.

## 2019-03-24 NOTE — Progress Notes (Signed)
TELEHEALTH VIRTUAL POSTPARTUM VISIT ENCOUNTER NOTE  I connected with@ on 03/25/19 at  4:15 PM EDT by telephone at home and verified that I am speaking with the correct person using two identifiers.   I discussed the limitations, risks, security and privacy concerns of performing an evaluation and management service by telephone and the availability of in person appointments. I also discussed with the patient that there may be a patient responsible charge related to this service. The patient expressed understanding and agreed to proceed.  Appointment Date: 03/25/2019  OBGYN Clinic: [facility]   Chief Complaint:  Postpartum Visit  History of Present Illness: Andrea Espinoza is a 23 y.o. African-American [id number] (No LMP recorded.), seen for the above chief complaint. Her past medical history is significant for HELLP, pre E with previous pregnancy. No issues with this pregnancy.    She is s/p normal spontaneous vaginal delivery on 7/8 at 39 weeks; she was discharged to home on D#2. Pregnancy complicated by preeclampsia. Baby is doing well  Complains of None. Does not want patch for BC due to breast feeding.   Vaginal bleeding or discharge: No  Mode of feeding infant: Bottle and Breast Intercourse: No  Contraception: oral progesterone-only contraceptive PP depression s/s: No .  Any bowel or bladder issues: No  Pap smear: NA age    Review of Systems:  Her 12 point review of systems is negative or as noted in the History of Present Illness.  Patient Active Problem List   Diagnosis Date Noted  . Indication for care in labor or delivery 02/24/2019  . Normal labor 02/24/2019  . Vaginal delivery 02/24/2019  . Postpartum hemorrhage 02/24/2019  . Pyelectasis of fetus on prenatal ultrasound 12/09/2018  . Supervision of high risk pregnancy, antepartum 08/18/2018  . History of pre-eclampsia 08/18/2018  . History of HELLP syndrome, currently pregnant 08/18/2018    Medications Aury Scollard had no  medications administered during this visit. Current Outpatient Medications  Medication Sig Dispense Refill  . calcium carbonate (TUMS - DOSED IN MG ELEMENTAL CALCIUM) 500 MG chewable tablet Chew 1 tablet by mouth daily as needed for indigestion or heartburn.    Marland Kitchen ibuprofen (ADVIL) 600 MG tablet Take 1 tablet (600 mg total) by mouth every 6 (six) hours. 30 tablet 0  . Prenatal Multivit-Min-Fe-FA (PRENATAL VITAMINS PO) Take 1 tablet by mouth daily.     . norethindrone (MICRONOR) 0.35 MG tablet Take 1 tablet (0.35 mg total) by mouth daily. 1 Package 11   No current facility-administered medications for this visit.     Allergies Patient has no known allergies.  Physical Exam:  General:  Alert, oriented and cooperative.   Mental Status: Normal mood and affect perceived. Normal judgment and thought content.  Rest of physical exam deferred due to type of encounter  PP Depression Screening:   Edinburgh Postnatal Depression Scale - 03/24/19 1637      Edinburgh Postnatal Depression Scale:  In the Past 7 Days   I have been able to laugh and see the funny side of things.  0    I have looked forward with enjoyment to things.  0    I have blamed myself unnecessarily when things went wrong.  0    I have been anxious or worried for no good reason.  0    I have felt scared or panicky for no good reason.  0    Things have been getting on top of me.  0    I  have been so unhappy that I have had difficulty sleeping.  0    I have felt sad or miserable.  0    I have been so unhappy that I have been crying.  0    The thought of harming myself has occurred to me.  0    Edinburgh Postnatal Depression Scale Total  0       Assessment:Patient is a 23 y.o. [id number] who is 4 weeks postpartum from a normal spontaneous vaginal delivery.  She is doing well.   Plan:  Not able to check BP currently; she checked her BP later that day and it read.  BP: 108//66 BP normal at the time of discharge home    RTC as  needed  I discussed the assessment and treatment plan with the patient. The patient was provided an opportunity to ask questions and all were answered. The patient agreed with the plan and demonstrated an understanding of the instructions.   The patient was advised to call back or seek an in-person evaluation/go to the ED for any concerning postpartum symptoms.  I provided 10 minutes of non-face-to-face time during this encounter.   Venia CarbonJennifer , NP [facility], [facility]

## 2019-03-24 NOTE — Progress Notes (Signed)
Pt wants the [facility] Patch.

## 2019-04-14 ENCOUNTER — Telehealth: Payer: Self-pay | Admitting: Obstetrics and Gynecology

## 2019-04-14 DIAGNOSIS — B9689 Other specified bacterial agents as the cause of diseases classified elsewhere: Secondary | ICD-10-CM

## 2019-04-14 DIAGNOSIS — N760 Acute vaginitis: Secondary | ICD-10-CM

## 2019-04-14 MED ORDER — METRONIDAZOLE 500 MG PO TABS: 500.0000 mg | ORAL_TABLET | Freq: Two times a day (BID) | ORAL | 0 refills | Status: DC

## 2019-04-14 NOTE — Telephone Encounter (Signed)
I called Andrea Espinoza back and she reports she is having gray discharge and bad vaginal odor for about 2 weeks. She has had BV before. She denies resuming intercourse since delivery or concern for std. I explained due to covid we are still trying to limit exposure and since she has had BV before we can treat her for BV. I explained I will send flagyl into her pharmacy. I instructed her to take with food and no alcohol. I also informed her if it does not relieve her symptoms to call us back because she will need to be seen and evaluated . She voices understanding.  Linda,RN

## 2019-04-14 NOTE — Telephone Encounter (Signed)
Patient called with a discharge, and wants to be seen.

## 2019-10-20 ENCOUNTER — Other Ambulatory Visit: Payer: Self-pay

## 2019-10-20 ENCOUNTER — Emergency Department (HOSPITAL_COMMUNITY)
Admission: EM | Admit: 2019-10-20 | Discharge: 2019-10-20 | Disposition: A | Discharge status: Home / Self Care | Payer: Medicaid Other | Attending: Emergency Medicine | Admitting: Emergency Medicine

## 2019-10-20 ENCOUNTER — Encounter (HOSPITAL_COMMUNITY): Payer: Self-pay

## 2019-10-20 DIAGNOSIS — Z5320 Procedure and treatment not carried out because of patient's decision for unspecified reasons: Secondary | ICD-10-CM | POA: Insufficient documentation

## 2019-10-20 DIAGNOSIS — R1031 Right lower quadrant pain: Secondary | ICD-10-CM | POA: Insufficient documentation

## 2019-10-20 DIAGNOSIS — F4310 Post-traumatic stress disorder, unspecified: Secondary | ICD-10-CM | POA: Insufficient documentation

## 2019-10-20 DIAGNOSIS — N939 Abnormal uterine and vaginal bleeding, unspecified: Secondary | ICD-10-CM | POA: Insufficient documentation

## 2019-10-20 DIAGNOSIS — F319 Bipolar disorder, unspecified: Secondary | ICD-10-CM | POA: Insufficient documentation

## 2019-10-20 DIAGNOSIS — Z79899 Other long term (current) drug therapy: Secondary | ICD-10-CM | POA: Insufficient documentation

## 2019-10-20 DIAGNOSIS — Z87891 Personal history of nicotine dependence: Secondary | ICD-10-CM | POA: Insufficient documentation

## 2019-10-20 LAB — CBC WITH DIFFERENTIAL/PLATELET
Abs Immature Granulocytes: 0.01 10*3/uL (ref 0.00–0.07)
Basophils Absolute: 0 10*3/uL (ref 0.0–0.1)
Basophils Relative: 1 %
Eosinophils Absolute: 0 10*3/uL (ref 0.0–0.5)
Eosinophils Relative: 0 %
HCT: 39.5 % (ref 36.0–46.0)
Hemoglobin: 12.7 g/dL (ref 12.0–15.0)
Immature Granulocytes: 0 %
Lymphocytes Relative: 41 %
Lymphs Abs: 1.5 10*3/uL (ref 0.7–4.0)
MCH: 26.5 pg (ref 26.0–34.0)
MCHC: 32.2 g/dL (ref 30.0–36.0)
MCV: 82.5 fL (ref 80.0–100.0)
Monocytes Absolute: 0.3 10*3/uL (ref 0.1–1.0)
Monocytes Relative: 7 %
Neutro Abs: 1.9 10*3/uL (ref 1.7–7.7)
Neutrophils Relative %: 51 %
Platelets: 229 10*3/uL (ref 150–400)
RBC: 4.79 MIL/uL (ref 3.87–5.11)
RDW: 14.6 % (ref 11.5–15.5)
WBC: 3.7 10*3/uL — ABNORMAL LOW (ref 4.0–10.5)
nRBC: 0 % (ref 0.0–0.2)

## 2019-10-20 LAB — URINALYSIS, ROUTINE W REFLEX MICROSCOPIC
Bilirubin Urine: NEGATIVE
Glucose, UA: NEGATIVE mg/dL
Ketones, ur: NEGATIVE mg/dL
Leukocytes,Ua: NEGATIVE
Nitrite: NEGATIVE
Protein, ur: NEGATIVE mg/dL
RBC / HPF: >50 RBC/hpf — ABNORMAL HIGH (ref 0–5)
Specific Gravity, Urine: 1.01 (ref 1.005–1.030)
pH: 7 (ref 5.0–8.0)

## 2019-10-20 LAB — RAPID HIV SCREEN (HIV 1/2 AB+AG)
HIV 1/2 Antibodies: NONREACTIVE
HIV-1 P24 Antigen - HIV24: NONREACTIVE

## 2019-10-20 LAB — WET PREP, GENITAL
Clue Cells Wet Prep HPF POC: NONE SEEN
Sperm: NONE SEEN
Trich, Wet Prep: NONE SEEN
Yeast Wet Prep HPF POC: NONE SEEN

## 2019-10-20 NOTE — ED Provider Notes (Signed)
[facility] EMERGENCY DEPARTMENT Provider Note   CSN: [id number] Arrival date & time: 10/20/19  6269     History Chief Complaint  Patient presents with  . Abdominal Pain  . Vaginal Bleeding    Andrea Espinoza is a 24 y.o. female.  The history is provided by the patient. No language interpreter was used.  Abdominal Pain Associated symptoms: vaginal bleeding   Vaginal Bleeding Associated symptoms: abdominal pain    Andrea Espinoza is a 24 y.o. female who presents to the Emergency Department complaining of abdominal pain.  She presents to the ED complaining of right sided abdominal pain that began yesterday.  Pain is described as aching and worse with eating.  She took plan B 4/85 (not certain of the date exactly).  LMP 2/5.  She began having vaginal bleeding on 2/23.  She is having heavier than a normal cycle.  She has occasional small clots.  Denies fevers, nausea, dysuria.  She is a [id number] with hx/o preeclampsia,  vaginal deliveries.  Youngest child is 60 mo old.  Sxs are moderate in nature    Past Medical History:  Diagnosis Date  . Bipolar affective disorder ([place])   . Complication of anesthesia   . Mental disorder    bipolar, PTSD  . Preeclampsia   . PTSD (post-traumatic stress disorder)     Patient Active Problem List   Diagnosis Date Noted  . Postpartum care and examination 03/25/2019  . Indication for care in labor or delivery 02/24/2019  . Normal labor 02/24/2019  . Vaginal delivery 02/24/2019  . Postpartum hemorrhage 02/24/2019  . Pyelectasis of fetus on prenatal ultrasound 12/09/2018  . Supervision of high risk pregnancy, antepartum 08/18/2018  . History of pre-eclampsia 08/18/2018  . History of HELLP syndrome, currently pregnant 08/18/2018    Past Surgical History:  Procedure Laterality Date  . WISDOM TOOTH EXTRACTION       OB History    Gravida  3   Para  2   Term  1   Preterm  1   AB  1   Living  2     SAB  0   TAB  1   Ectopic  0   Multiple  0   Live Births  2           Family History  Problem Relation Age of Onset  . Diabetes Maternal Grandmother     Social History   Tobacco Use  . Smoking status: Former Smoker    Types: Cigarettes    Quit date: 09/11/2015    Years since quitting: 4.1  . Smokeless tobacco: Never Used  Substance Use Topics  . Alcohol use: No  . Drug use: No    Types: Marijuana    Home Medications Prior to Admission medications   Medication Sig Start Date End Date Taking? Authorizing Provider  calcium carbonate (TUMS - DOSED IN MG ELEMENTAL CALCIUM) 500 MG chewable tablet Chew 1 tablet by mouth daily as needed for indigestion or heartburn.    [provider]  ibuprofen (ADVIL) 600 MG tablet Take 1 tablet (600 mg total) by mouth every 6 (six) hours. 02/26/19   Jorje Guild, NP  metroNIDAZOLE (FLAGYL) 500 MG tablet Take 1 tablet (500 mg total) by mouth 2 (two) times daily. 04/14/19   Donnamae Jude, MD  norethindrone (MICRONOR) 0.35 MG tablet Take 1 tablet (0.35 mg total) by mouth daily. 03/24/19   Rasch, Artist Pais, NP  Prenatal Multivit-Min-Fe-FA (PRENATAL VITAMINS PO)  Take 1 tablet by mouth daily.     [provider]    Allergies    Patient has no known allergies.  Review of Systems   Review of Systems  Gastrointestinal: Positive for abdominal pain.  Genitourinary: Positive for vaginal bleeding.  All other systems reviewed and are negative.   Physical Exam Updated Vital Signs BP 124/80 (BP Location: Left Arm)   Pulse 86   Temp 98 F (36.7 C) (Oral)   Resp 16   SpO2 100%   Physical Exam Vitals and nursing note reviewed.  Constitutional:      Appearance: She is well-developed.  HENT:     Head: Normocephalic and atraumatic.  Cardiovascular:     Rate and Rhythm: Normal rate and regular rhythm.     Heart sounds: No murmur.  Pulmonary:     Effort: Pulmonary effort is normal. No respiratory distress.     Breath sounds: Normal breath  sounds.  Abdominal:     Palpations: Abdomen is soft.     Tenderness: There is no guarding or rebound.     Comments: Mild RLQ tenderness  Genitourinary:    Comments: Moderate amount of agile bleeding. Also close. No CMT or next tenderness. Musculoskeletal:        General: No tenderness.  Skin:    General: Skin is warm and dry.  Neurological:     Mental Status: She is alert and oriented to person, place, and time.  Psychiatric:        Behavior: Behavior normal.     ED Results / Procedures / Treatments   Labs (all labs ordered are listed, but only abnormal results are displayed) Labs Reviewed - No data to display  EKG None  Radiology No results found.  Procedures Procedures (including critical care time)  Medications Ordered in ED Medications - No data to display  ED Course  I have reviewed the triage vital signs and the nursing notes.  Pertinent labs & imaging results that were available during my care of the patient were reviewed by me and considered in my medical decision making (see chart for details).    MDM Rules/Calculators/A&P                     Patient here for evaluation of vaginal bleeding, lower abdominal pain. She took plan B on February 10. She has not taken any pregnancy tests at home. Abdominal examination is benign, presentation is not consistent with appendicitis. Labs were drawn and were hemolyzed and HCG could not be obtained off of initial lab draw. Patient needs to leave to get her children and cannot stay for a full evaluation. Discussed with patient that she has had an incomplete evaluation at this time because it is critical that pregnancy test is performed as she will need to have ectopic pregnancy ruled out. Patient understands that this evaluation is incomplete and she needs further workup. Patient is unable to stay in ED any longer and acknowledges these risks due to child care needs. Discussed importance of prompt follow-up for complete  evaluation. Final Clinical Impression(s) / ED Diagnoses Final diagnoses:  Vaginal bleeding  Right lower quadrant abdominal pain    Rx / DC Orders ED Discharge Orders    None       Tilden Fossa, MD 10/20/19 [phone]

## 2019-10-20 NOTE — ED Notes (Signed)
Patient verbalizes understanding of discharge instructions . Opportunity for questions and answers were provided . Armband removed by staff ,Pt discharged from ED. W/C  offered at D/C  and Declined W/C at D/C and was escorted to lobby by RN.  

## 2019-10-20 NOTE — ED Triage Notes (Signed)
Pt reports RLQ pain since last night that radiates to her back. Pt also reports heavy vaginal bleeding for the past 6 days. States she took a plan B about a week ago and she started bleeding after that. Pt a.o, ambulatory.

## 2019-10-20 NOTE — ED Notes (Signed)
Got patient on the monitor patient is resting with call bell in reach  ?

## 2019-10-20 NOTE — ED Notes (Signed)
DR Madilyn Hook aware Pt has to leave at 12:30 and labs have not been redrawn. Pt understands her work up will not have results of all Blood work.

## 2019-10-21 LAB — GC/CHLAMYDIA PROBE AMP (~~LOC~~) NOT AT ARMC
Chlamydia: NEGATIVE
Neisseria Gonorrhea: NEGATIVE

## 2020-05-02 ENCOUNTER — Emergency Department (HOSPITAL_COMMUNITY)
Admission: EM | Admit: 2020-05-02 | Discharge: 2020-05-02 | Disposition: A | Discharge status: Home / Self Care | Payer: Medicaid Other | Attending: Emergency Medicine | Admitting: Emergency Medicine

## 2020-05-02 ENCOUNTER — Other Ambulatory Visit: Payer: Self-pay

## 2020-05-02 ENCOUNTER — Encounter (HOSPITAL_COMMUNITY): Payer: Self-pay

## 2020-05-02 DIAGNOSIS — Z5321 Procedure and treatment not carried out due to patient leaving prior to being seen by health care provider: Secondary | ICD-10-CM | POA: Diagnosis not present

## 2020-05-02 DIAGNOSIS — J029 Acute pharyngitis, unspecified: Secondary | ICD-10-CM | POA: Diagnosis not present

## 2020-05-02 DIAGNOSIS — Z20822 Contact with and (suspected) exposure to covid-19: Secondary | ICD-10-CM | POA: Insufficient documentation

## 2020-05-02 LAB — GROUP A STREP BY PCR: Group A Strep by PCR: NOT DETECTED

## 2020-05-02 LAB — SARS CORONAVIRUS 2 BY RT PCR (HOSPITAL ORDER, PERFORMED IN ~~LOC~~ HOSPITAL LAB): SARS Coronavirus 2: NEGATIVE

## 2020-05-02 NOTE — ED Notes (Signed)
Pt left without being seen after triage

## 2020-05-02 NOTE — ED Notes (Signed)
Pt eloped from waiting area. Called 3X.  

## 2020-05-02 NOTE — ED Triage Notes (Signed)
Pt sts sore throat worse to the left side.

## 2020-09-27 ENCOUNTER — Emergency Department (HOSPITAL_COMMUNITY)
Admission: EM | Admit: 2020-09-27 | Discharge: 2020-09-27 | Disposition: A | Discharge status: Home / Self Care | Payer: Medicaid Other | Attending: Emergency Medicine | Admitting: Emergency Medicine

## 2020-09-27 ENCOUNTER — Encounter (HOSPITAL_COMMUNITY): Payer: Self-pay | Admitting: Emergency Medicine

## 2020-09-27 ENCOUNTER — Other Ambulatory Visit: Payer: Self-pay

## 2020-09-27 DIAGNOSIS — H60312 Diffuse otitis externa, left ear: Secondary | ICD-10-CM | POA: Insufficient documentation

## 2020-09-27 DIAGNOSIS — Z87891 Personal history of nicotine dependence: Secondary | ICD-10-CM | POA: Insufficient documentation

## 2020-09-27 DIAGNOSIS — H9202 Otalgia, left ear: Secondary | ICD-10-CM | POA: Diagnosis present

## 2020-09-27 MED ORDER — CIPROFLOXACIN-DEXAMETHASONE 0.3-0.1 % OT SUSP: 4.0000 [drp] | Freq: Two times a day (BID) | OTIC | 0 refills | Status: DC

## 2020-09-27 NOTE — ED Provider Notes (Signed)
[facility] EMERGENCY DEPARTMENT Provider Note   CSN: [id number] Arrival date & time: 09/27/20  1126     History Chief Complaint  Patient presents with  . Otalgia    Andrea Espinoza is a 25 y.o. female.  Pt with bipolar hx presents with left ear pain for a month, constant ache, no drainage. Pt has not put anything inside. No fevers.        Past Medical History:  Diagnosis Date  . Bipolar affective disorder ([facility])   . Complication of anesthesia   . Mental disorder    bipolar, PTSD  . Preeclampsia   . PTSD (post-traumatic stress disorder)     Patient Active Problem List   Diagnosis Date Noted  . Postpartum care and examination 03/25/2019  . Indication for care in labor or delivery 02/24/2019  . Normal labor 02/24/2019  . Vaginal delivery 02/24/2019  . Postpartum hemorrhage 02/24/2019  . Pyelectasis of fetus on prenatal ultrasound 12/09/2018  . Supervision of high risk pregnancy, antepartum 08/18/2018  . History of pre-eclampsia 08/18/2018  . History of HELLP syndrome, currently pregnant 08/18/2018    Past Surgical History:  Procedure Laterality Date  . WISDOM TOOTH EXTRACTION       OB History    Gravida  3   Para  2   Term  1   Preterm  1   AB  1   Living  2     SAB  0   IAB  1   Ectopic  0   Multiple  0   Live Births  2           Family History  Problem Relation Age of Onset  . Diabetes Maternal Grandmother     Social History   Tobacco Use  . Smoking status: Former Smoker    Types: Cigarettes    Quit date: 09/11/2015    Years since quitting: 5.0  . Smokeless tobacco: Never Used  Vaping Use  . Vaping Use: Never used  Substance Use Topics  . Alcohol use: No  . Drug use: No    Types: Marijuana    Home Medications Prior to Admission medications   Medication Sig Start Date End Date Taking? Authorizing Provider  ciprofloxacin-dexamethasone (CIPRODEX) OTIC suspension Place 4 drops into the left ear 2 (two)  times daily. 09/27/20  Yes Blane Ohara, MD  calcium carbonate (TUMS - DOSED IN MG ELEMENTAL CALCIUM) 500 MG chewable tablet Chew 1 tablet by mouth daily as needed for indigestion or heartburn.    [provider]  ibuprofen (ADVIL) 600 MG tablet Take 1 tablet (600 mg total) by mouth every 6 (six) hours. 02/26/19   Judeth Horn, NP  metroNIDAZOLE (FLAGYL) 500 MG tablet Take 1 tablet (500 mg total) by mouth 2 (two) times daily. 04/14/19   Reva Bores, MD  norethindrone (MICRONOR) 0.35 MG tablet Take 1 tablet (0.35 mg total) by mouth daily. 03/24/19   Rasch, Victorino Dike I, NP  Prenatal Multivit-Min-Fe-FA (PRENATAL VITAMINS PO) Take 1 tablet by mouth daily.     [provider]    Allergies    Patient has no known allergies.  Review of Systems   Review of Systems  Constitutional: Negative for chills and fever.  HENT: Positive for ear pain.   Respiratory: Negative for shortness of breath.   Cardiovascular: Negative for chest pain.  Gastrointestinal: Negative for abdominal pain and vomiting.  Musculoskeletal: Negative for back pain, neck pain and neck stiffness.  Skin: Negative  for rash.  Neurological: Negative for light-headedness and headaches.    Physical Exam Updated Vital Signs BP 105/70 (BP Location: Left Arm)   Pulse 69   Temp 99.2 F (37.3 C) (Oral)   Resp 14   LMP  (Within Weeks)   SpO2 100%   Physical Exam Vitals and nursing note reviewed.  Constitutional:      Appearance: She is well-developed and well-nourished.  HENT:     Head: Normocephalic and atraumatic.     Comments: Inflamed canal with mild drainage left ear. Unable to visualize TM  No mastoid tenderness Eyes:     General:        Right eye: No discharge.        Left eye: No discharge.     Conjunctiva/sclera: Conjunctivae normal.  Neck:     Trachea: No tracheal deviation.  Cardiovascular:     Rate and Rhythm: Normal rate.  Pulmonary:     Effort: Pulmonary effort is normal.  Abdominal:      Tenderness: There is no guarding.  Musculoskeletal:        General: No edema.     Cervical back: Normal range of motion.  Skin:    General: Skin is warm.     Findings: No rash.  Neurological:     Mental Status: She is alert and oriented to person, place, and time.  Psychiatric:        Mood and Affect: Mood and affect normal.     ED Results / Procedures / Treatments   Labs (all labs ordered are listed, but only abnormal results are displayed) Labs Reviewed - No data to display  EKG None  Radiology No results found.  Procedures Procedures   Medications Ordered in ED Medications - No data to display  ED Course  I have reviewed the triage vital signs and the nursing notes.  Pertinent labs & imaging results that were available during my care of the patient were reviewed by me and considered in my medical decision making (see chart for details).    MDM Rules/Calculators/A&P                          Clinically otitis externa, plan for abx drops with steroids and supportive care. No signs of mastoiditis or other more serious infection.   Final Clinical Impression(s) / ED Diagnoses Final diagnoses:  Acute diffuse otitis externa of left ear    Rx / DC Orders ED Discharge Orders         Ordered    ciprofloxacin-dexamethasone (CIPRODEX) OTIC suspension  2 times daily        09/27/20 1209           Blane Ohara, MD 09/27/20 1211

## 2020-09-27 NOTE — Discharge Instructions (Signed)
Use drops as directed for 1 week. Tylenol and motrin for pain every 6 hrs. Return if no improvement or worsening concerns.

## 2020-09-27 NOTE — ED Triage Notes (Signed)
Patient here for left ear pain that started several months ago. States she has not had it evaluated before. Denies drainage from ear.

## 2020-11-17 ENCOUNTER — Emergency Department (HOSPITAL_COMMUNITY)
Admission: EM | Admit: 2020-11-17 | Discharge: 2020-11-17 | Disposition: A | Discharge status: Home / Self Care | Payer: Medicaid Other | Attending: Emergency Medicine | Admitting: Emergency Medicine

## 2020-11-17 ENCOUNTER — Other Ambulatory Visit: Payer: Self-pay

## 2020-11-17 ENCOUNTER — Encounter (HOSPITAL_COMMUNITY): Payer: Self-pay

## 2020-11-17 ENCOUNTER — Emergency Department (HOSPITAL_COMMUNITY): Payer: Medicaid Other

## 2020-11-17 DIAGNOSIS — Z87891 Personal history of nicotine dependence: Secondary | ICD-10-CM | POA: Diagnosis not present

## 2020-11-17 DIAGNOSIS — N390 Urinary tract infection, site not specified: Secondary | ICD-10-CM | POA: Diagnosis not present

## 2020-11-17 DIAGNOSIS — R1031 Right lower quadrant pain: Secondary | ICD-10-CM | POA: Diagnosis present

## 2020-11-17 DIAGNOSIS — R102 Pelvic and perineal pain: Secondary | ICD-10-CM

## 2020-11-17 LAB — CBC WITH DIFFERENTIAL/PLATELET
Abs Immature Granulocytes: 0.01 10*3/uL (ref 0.00–0.07)
Basophils Absolute: 0 10*3/uL (ref 0.0–0.1)
Basophils Relative: 1 %
Eosinophils Absolute: 0 10*3/uL (ref 0.0–0.5)
Eosinophils Relative: 0 %
HCT: 37.9 % (ref 36.0–46.0)
Hemoglobin: 12.4 g/dL (ref 12.0–15.0)
Immature Granulocytes: 0 %
Lymphocytes Relative: 25 %
Lymphs Abs: 1.5 10*3/uL (ref 0.7–4.0)
MCH: 26.6 pg (ref 26.0–34.0)
MCHC: 32.7 g/dL (ref 30.0–36.0)
MCV: 81.2 fL (ref 80.0–100.0)
Monocytes Absolute: 0.6 10*3/uL (ref 0.1–1.0)
Monocytes Relative: 9 %
Neutro Abs: 3.9 10*3/uL (ref 1.7–7.7)
Neutrophils Relative %: 65 %
Platelets: 179 10*3/uL (ref 150–400)
RBC: 4.67 MIL/uL (ref 3.87–5.11)
RDW: 14.1 % (ref 11.5–15.5)
WBC: 6 10*3/uL (ref 4.0–10.5)
nRBC: 0 % (ref 0.0–0.2)

## 2020-11-17 LAB — URINALYSIS, ROUTINE W REFLEX MICROSCOPIC
Bilirubin Urine: NEGATIVE
Glucose, UA: NEGATIVE mg/dL
Hgb urine dipstick: NEGATIVE
Ketones, ur: NEGATIVE mg/dL
Nitrite: NEGATIVE
Protein, ur: NEGATIVE mg/dL
Specific Gravity, Urine: 1.025 (ref 1.005–1.030)
pH: 7 (ref 5.0–8.0)

## 2020-11-17 LAB — COMPREHENSIVE METABOLIC PANEL
ALT: 13 U/L (ref 0–44)
AST: 15 U/L (ref 15–41)
Albumin: 3.6 g/dL (ref 3.5–5.0)
Alkaline Phosphatase: 38 U/L (ref 38–126)
Anion gap: 8 (ref 5–15)
BUN: 11 mg/dL (ref 6–20)
CO2: 21 mmol/L — ABNORMAL LOW (ref 22–32)
Calcium: 8.8 mg/dL — ABNORMAL LOW (ref 8.9–10.3)
Chloride: 109 mmol/L (ref 98–111)
Creatinine, Ser: 0.88 mg/dL (ref 0.44–1.00)
GFR, Estimated: >60 mL/min (ref >60)
Glucose, Bld: 93 mg/dL (ref 70–99)
Potassium: 4.1 mmol/L (ref 3.5–5.1)
Sodium: 138 mmol/L (ref 135–145)
Total Bilirubin: 0.8 mg/dL (ref 0.3–1.2)
Total Protein: 6 g/dL — ABNORMAL LOW (ref 6.5–8.1)

## 2020-11-17 LAB — WET PREP, GENITAL
Clue Cells Wet Prep HPF POC: NONE SEEN
Sperm: NONE SEEN
Trich, Wet Prep: NONE SEEN
Yeast Wet Prep HPF POC: NONE SEEN

## 2020-11-17 LAB — LIPASE, BLOOD: Lipase: 34 U/L (ref 11–51)

## 2020-11-17 LAB — I-STAT BETA HCG BLOOD, ED (MC, WL, AP ONLY): I-stat hCG, quantitative: <5 m[IU]/mL (ref <5)

## 2020-11-17 MED ORDER — CEPHALEXIN 500 MG PO CAPS: 500.0000 mg | ORAL_CAPSULE | Freq: Two times a day (BID) | ORAL | 0 refills | Status: AC

## 2020-11-17 NOTE — ED Triage Notes (Signed)
PT states having abdominal pain, started last Tuesday, intermittent. Due for period. Now having some vaginal bleeding with odor.

## 2020-11-17 NOTE — Discharge Instructions (Signed)
Please pick up antibiotics and take as prescribed to cover for a urinary tract infection. We have sent your urine for culture and will call you in 2-3 days time if the antibiotic needs to be changed.   We have also tested for gonorrhea and chlamydia today. Please await your results and refrain from intercourse until your results return. We will call you if you test positive for either however you can also check your results via MyChart. If positive please return to the ED, any urgent care, or the local health department to seek treatment. All partners will need to be tested and treated should your results return positive.   Please follow up with your PCP regarding your ED visit and for recheck of your urine/symptoms in 1 weeks time.   Return to the ED for any worsening symptoms

## 2020-11-17 NOTE — ED Provider Notes (Signed)
[facility] EMERGENCY DEPARTMENT Provider Note   CSN: [id number] Arrival date & time: 11/17/20  7846     History Chief Complaint  Patient presents with  . Abdominal Pain    Andrea Espinoza is a 25 y.o. female who presents to the ED today with complaint of gradual onset, constant, waxing and waning, RLQ abdominal pain that began last week.  Patient reports she was having intercourse with her significant other when she began having mild pain as well as like vaginal spotting.  She states that they had intercourse again with worsening pain and worsening heavy vaginal bleeding.  She reports that the vaginal bleeding has stopped however now she is having a foul odor. She does not believe she is having discharge. She denies any itching.  She states that this is her regular sexual partner and she is not concerned about STDs.  She did have a regular STD screening about 3 weeks ago and reports that everything was normal.  Patient denies any fevers, chills, nausea, vomiting, diarrhea, urinary symptoms.  No previous past surgical history to abdomen.  Her last normal menstrual period was sometime in early March.  She states she believes she is due to have her next menstrual period on April 5.   The history is provided by the patient and medical records.       Past Medical History:  Diagnosis Date  . Bipolar affective disorder (HCC)   . Complication of anesthesia   . Mental disorder    bipolar, PTSD  . Preeclampsia   . PTSD (post-traumatic stress disorder)     Patient Active Problem List   Diagnosis Date Noted  . Postpartum care and examination 03/25/2019  . Indication for care in labor or delivery 02/24/2019  . Normal labor 02/24/2019  . Vaginal delivery 02/24/2019  . Postpartum hemorrhage 02/24/2019  . Pyelectasis of fetus on prenatal ultrasound 12/09/2018  . Supervision of high risk pregnancy, antepartum 08/18/2018  . History of pre-eclampsia 08/18/2018  . History of HELLP  syndrome, currently pregnant 08/18/2018    Past Surgical History:  Procedure Laterality Date  . WISDOM TOOTH EXTRACTION       OB History    Gravida  3   Para  2   Term  1   Preterm  1   AB  1   Living  2     SAB  0   IAB  1   Ectopic  0   Multiple  0   Live Births  2           Family History  Problem Relation Age of Onset  . Diabetes Maternal Grandmother     Social History   Tobacco Use  . Smoking status: Former Smoker    Types: Cigarettes    Quit date: 09/11/2015    Years since quitting: 5.1  . Smokeless tobacco: Never Used  Vaping Use  . Vaping Use: Never used  Substance Use Topics  . Alcohol use: No  . Drug use: No    Types: Marijuana    Home Medications Prior to Admission medications   Medication Sig Start Date End Date Taking? Authorizing Provider  cephALEXin (KEFLEX) 500 MG capsule Take 1 capsule (500 mg total) by mouth 2 (two) times daily for 5 days. 11/17/20 11/22/20 Yes Kylah Maresh, PA-C  ciprofloxacin-dexamethasone (CIPRODEX) OTIC suspension Place 4 drops into the left ear 2 (two) times daily. Patient not taking: Reported on 11/17/2020 09/27/20   Blane Ohara, MD  ibuprofen (ADVIL) 600 MG tablet Take 1 tablet (600 mg total) by mouth every 6 (six) hours. Patient not taking: Reported on 11/17/2020 02/26/19   Judeth Horn, NP  metroNIDAZOLE (FLAGYL) 500 MG tablet Take 1 tablet (500 mg total) by mouth 2 (two) times daily. Patient not taking: Reported on 11/17/2020 04/14/19   Reva Bores, MD  norethindrone (MICRONOR) 0.35 MG tablet Take 1 tablet (0.35 mg total) by mouth daily. Patient not taking: Reported on 11/17/2020 03/24/19   Rasch, Harolyn Rutherford, NP    Allergies    Patient has no known allergies.  Review of Systems   Review of Systems  Constitutional: Negative for chills and fever.  Gastrointestinal: Positive for abdominal pain. Negative for constipation, diarrhea, nausea and vomiting.  Genitourinary: Positive for vaginal bleeding  (resolved). Negative for dysuria, flank pain, frequency, hematuria and vaginal discharge.  All other systems reviewed and are negative.   Physical Exam Updated Vital Signs BP 112/70 (BP Location: Left Arm)   Pulse 69   Temp 98.9 F (37.2 C) (Oral)   Resp 14   LMP 10/21/2020 (Within Weeks)   SpO2 100%   Physical Exam Vitals and nursing note reviewed.  Constitutional:      Appearance: She is not ill-appearing or diaphoretic.  HENT:     Head: Normocephalic and atraumatic.  Eyes:     Conjunctiva/sclera: Conjunctivae normal.  Cardiovascular:     Rate and Rhythm: Normal rate and regular rhythm.     Heart sounds: Normal heart sounds.  Pulmonary:     Effort: Pulmonary effort is normal.     Breath sounds: Normal breath sounds. No wheezing, rhonchi or rales.  Abdominal:     General: Abdomen is flat.     Palpations: Abdomen is soft.     Tenderness: There is abdominal tenderness in the right lower quadrant. There is no guarding or rebound.  Genitourinary:    Comments: Chaperone present for exam. No rashes, lesions, or tenderness to external genitalia. No erythema, injury, or tenderness to vaginal mucosa. Thin yellow vaginal discharge to vault. + right adnexal TTP. No  CMT, cervical friability, or discharge from cervical os. Cervical os is closed. Uterus non-deviated, mobile, nonTTP, and without enlargement.  Musculoskeletal:     Cervical back: Neck supple.  Skin:    General: Skin is warm and dry.  Neurological:     Mental Status: She is alert.     ED Results / Procedures / Treatments   Labs (all labs ordered are listed, but only abnormal results are displayed) Labs Reviewed  WET PREP, GENITAL - Abnormal; Notable for the following components:      Result Value   WBC, Wet Prep HPF POC MANY (*)    All other components within normal limits  URINALYSIS, ROUTINE W REFLEX MICROSCOPIC - Abnormal; Notable for the following components:   APPearance HAZY (*)    Leukocytes,Ua LARGE (*)     Bacteria, UA RARE (*)    All other components within normal limits  COMPREHENSIVE METABOLIC PANEL - Abnormal; Notable for the following components:   CO2 21 (*)    Calcium 8.8 (*)    Total Protein 6.0 (*)    All other components within normal limits  URINE CULTURE  LIPASE, BLOOD  CBC WITH DIFFERENTIAL/PLATELET  I-STAT BETA HCG BLOOD, ED ([facility], [facility], [facility] ONLY)  GC/CHLAMYDIA PROBE AMP ([place]) NOT AT [facility]    EKG None  Radiology US PELVIC COMPLETE W TRANSVAGINAL AND TORSION R/O  Result Date: 11/17/2020  CLINICAL DATA:  Right pelvic pain.  Question torsion. EXAM: TRANSABDOMINAL AND TRANSVAGINAL ULTRASOUND OF PELVIS DOPPLER ULTRASOUND OF OVARIES TECHNIQUE: Both transabdominal and transvaginal ultrasound examinations of the pelvis were performed. Transabdominal technique was performed for global imaging of the pelvis including uterus, ovaries, adnexal regions, and pelvic cul-de-sac. It was necessary to proceed with endovaginal exam following the transabdominal exam to visualize the adnexa. Color and duplex Doppler ultrasound was utilized to evaluate blood flow to the ovaries. COMPARISON:  None. FINDINGS: Uterus Measurements: 8.1 x 5.1 x 6.6 cm = volume: 144.8 mL. No fibroids or other mass visualized. Endometrium Thickness: 0.7 cm.  No focal abnormality visualized. Right ovary Measurements: 4.5 x 2.9 x 3.5 cm = volume: 23.9 mL. Normal appearance/no adnexal mass. Left ovary Measurements: 4.2 x 1.9 x 3.0 cm = volume: 12 mL. Normal appearance/no adnexal mass. Pulsed Doppler evaluation of both ovaries demonstrates normal low-resistance arterial and venous waveforms. Other findings There is a small volume of debris containing fluid about the right ovary. IMPRESSION: Negative for ovarian torsion.  The uterus and ovaries appear normal. Small volume of debris containing free pelvic fluid. Cause of the fluid is not identified. Electronically Signed   By: Drusilla Kannerhomas  Dalessio M.D.   On: 11/17/2020 11:13     Procedures Procedures   Medications Ordered in ED Medications - No data to display  ED Course  I have reviewed the triage vital signs and the nursing notes.  Pertinent labs & imaging results that were available during my care of the patient were reviewed by me and considered in my medical decision making (see chart for details).    MDM Rules/Calculators/A&P                          25 year old female presents to the ED today complaining of right lower quadrant abdominal pain, vaginal bleeding after intercourse, now having foul odor.  On arrival to the ED vitals are stable.  Patient is afebrile, nontachycardic and nontachypneic and appears to be in no acute distress.  Last normal menstrual period 3/05.  She is no longer having vaginal bleeding however states she is due for her next menstrual cycle on 04/05.  On exam she has right lower quadrant abdominal tenderness palpation without rebound or guarding.  Plan for pelvic exam at this time as well as lab work.  Will check for D&C at this time.  Patient may require imaging depending on what the pelvic exam shows.  It does appear she was seen in the ED 1 year ago for similar symptoms however unfortunately left prior to her work-up being complete as it appears her blood hemolyzed.  She was advised to stay with concern for possible ectopic pregnancy at that time as they could not rule out if she was pregnant due to her blood hemolyzing.   Beta hcg neg Pelvic exam performed with right sided adnexal TTP and thin yellow discharge in vault. Will obtain ultrasound at this time given pelvic pain.   Wet prep without obvious findings U/A has returned with large leuks, 6-10 RBCs per HPF, 21-50 WBCs per HPF, rare bacteria and only 6-10 squamous epithelial cells. Will send for culture. Question if this is what pt is experiencing when describing foul odor. She reports she only notices it when she urinates. Denies frequency, urgency, dysuria  Remainder of  labwork including CBC, CMP, and lipase unremarkable. No leukocytosis to suggest infectious etiology. Low suspicion for acute abdominal process including appendicitis.  Ultrasound:   IMPRESSION:  Negative for ovarian torsion. The uterus and ovaries appear normal.    Small volume of debris containing free pelvic fluid. Cause of the  fluid is not identified.   Will treat for UTI at this time. Pt is not suspicious for STD at this time. Have instructed she await her STD results and return to the ED/health department for treatment should it return positive. She is in agreement to do so. Advised to refrain from intercourse until then. Stable for discharge at this time.   This note was prepared using Dragon voice recognition software and may include unintentional dictation errors due to the inherent limitations of voice recognition software.  Final Clinical Impression(s) / ED Diagnoses Final diagnoses:  Lower urinary tract infectious disease    Rx / DC Orders ED Discharge Orders         Ordered    cephALEXin (KEFLEX) 500 MG capsule  2 times daily        11/17/20 1134           Discharge Instructions     Please pick up antibiotics and take as prescribed to cover for a urinary tract infection. We have sent your urine for culture and will call you in 2-3 days time if the antibiotic needs to be changed.   We have also tested for gonorrhea and chlamydia today. Please await your results and refrain from intercourse until your results return. We will call you if you test positive for either however you can also check your results via MyChart. If positive please return to the ED, any urgent care, or the local health department to seek treatment. All partners will need to be tested and treated should your results return positive.   Please follow up with your PCP regarding your ED visit and for recheck of your urine/symptoms in 1 weeks time.   Return to the ED for any worsening  symptoms       Tanda Rockers, PA-C 11/17/20 1135    Long, Arlyss Repress, MD 11/21/20 1014

## 2020-11-17 NOTE — ED Notes (Signed)
Pt transported to US

## 2020-11-17 NOTE — ED Notes (Signed)
Pt returned to from US

## 2020-11-18 LAB — URINE CULTURE: Culture: <10000 — AB

## 2020-11-19 LAB — GC/CHLAMYDIA PROBE AMP (~~LOC~~) NOT AT ARMC
Chlamydia: POSITIVE — AB
Comment: NEGATIVE
Comment: NORMAL
Neisseria Gonorrhea: NEGATIVE

## 2020-11-22 ENCOUNTER — Telehealth: Payer: Self-pay | Admitting: Medical

## 2020-11-22 DIAGNOSIS — A749 Chlamydial infection, unspecified: Secondary | ICD-10-CM

## 2020-11-22 MED ORDER — AZITHROMYCIN 250 MG PO TABS: 1000.0000 mg | ORAL_TABLET | Freq: Once | ORAL | 0 refills | Status: AC

## 2020-11-22 NOTE — Telephone Encounter (Signed)
[name] tested positive for  Chlamydia. Patient was called by RN and allergies and pharmacy confirmed. Rx sent to pharmacy of choice.   Marny Lowenstein, PA-C 11/22/2020 4:32 PM

## 2020-11-22 NOTE — Telephone Encounter (Signed)
-----   Message from Kathe Becton, RN sent at 11/22/2020  4:27 PM EDT ----- This patient tested positive for :  Chlamydia  She "has NKDA", I have informed the patient of her results and confirmed her pharmacy is correct in her chart. Please send Rx.   Thank you,   Kathe Becton, RN   Results faxed to [facility] Department.

## 2021-07-24 ENCOUNTER — Encounter (HOSPITAL_COMMUNITY): Payer: Self-pay | Admitting: Emergency Medicine

## 2021-07-24 ENCOUNTER — Ambulatory Visit (HOSPITAL_COMMUNITY)
Admission: EM | Admit: 2021-07-24 | Discharge: 2021-07-24 | Disposition: A | Discharge status: Home / Self Care | Payer: Medicaid Other | Attending: Family | Admitting: Family

## 2021-07-24 ENCOUNTER — Inpatient Hospital Stay (HOSPITAL_COMMUNITY)
Admission: AD | Admit: 2021-07-24 | Discharge: 2021-07-24 | Disposition: A | Discharge status: Home / Self Care | Payer: Medicaid Other | Attending: Family Medicine | Admitting: Family Medicine

## 2021-07-24 ENCOUNTER — Encounter (HOSPITAL_COMMUNITY): Payer: Self-pay | Admitting: Family Medicine

## 2021-07-24 ENCOUNTER — Inpatient Hospital Stay (HOSPITAL_COMMUNITY): Payer: Medicaid Other

## 2021-07-24 ENCOUNTER — Other Ambulatory Visit: Payer: Self-pay

## 2021-07-24 DIAGNOSIS — Z3201 Encounter for pregnancy test, result positive: Secondary | ICD-10-CM

## 2021-07-24 DIAGNOSIS — Z87891 Personal history of nicotine dependence: Secondary | ICD-10-CM | POA: Diagnosis not present

## 2021-07-24 DIAGNOSIS — N939 Abnormal uterine and vaginal bleeding, unspecified: Secondary | ICD-10-CM

## 2021-07-24 DIAGNOSIS — Z9889 Other specified postprocedural states: Secondary | ICD-10-CM

## 2021-07-24 DIAGNOSIS — R102 Pelvic and perineal pain: Secondary | ICD-10-CM

## 2021-07-24 LAB — CBC
HCT: 36.6 % (ref 36.0–46.0)
Hemoglobin: 12 g/dL (ref 12.0–15.0)
MCH: 26.9 pg (ref 26.0–34.0)
MCHC: 32.8 g/dL (ref 30.0–36.0)
MCV: 82.1 fL (ref 80.0–100.0)
Platelets: 157 10*3/uL (ref 150–400)
RBC: 4.46 MIL/uL (ref 3.87–5.11)
RDW: 14.6 % (ref 11.5–15.5)
WBC: 5.9 10*3/uL (ref 4.0–10.5)
nRBC: 0 % (ref 0.0–0.2)

## 2021-07-24 LAB — TYPE AND SCREEN
ABO/RH(D): B POS
Antibody Screen: NEGATIVE

## 2021-07-24 LAB — POC URINE PREG, ED: Preg Test, Ur: POSITIVE — AB

## 2021-07-24 LAB — HCG, QUANTITATIVE, PREGNANCY: hCG, Beta Chain, Quant, S: 476 m[IU]/mL — ABNORMAL HIGH (ref <5)

## 2021-07-24 NOTE — ED Triage Notes (Signed)
Pt is present today with lower abdominal pain,  vaginal bleeding and blot clots. Pt states that she had a abortion 06/29/2021 and has experienced continuous bleeding.

## 2021-07-24 NOTE — MAU Note (Signed)
Took abortion pills on 11/11.  Started bleeding that day, became severe on the 16th. Changing pads every 2 hrs, at that point the pads are soaked and she is bleeding through pads. Wakes up with soaked pad, wet underwear and passing clots. Has been having a low grade fever. Mild cramping in lower abd. Felt dizzy one time yesterday.

## 2021-07-24 NOTE — MAU Provider Note (Signed)
History     CSN: [id number]  Arrival date and time: 07/24/21 1048   Event Date/Time   First Provider Initiated Contact with Patient 07/24/21 1130      Chief Complaint  Patient presents with   Vaginal Bleeding    HPI Andrea Espinoza is a 25 y.o. [id number] who presents to MAU for evaluation of heavy vaginal bleeding. She is s/p elective termination with [facility] on 06/29/2021. Patient reports heavy vaginal bleeding, onset 06/29/2021. She has continued to experience daily heavy bleeding since that time. She is saturating pads in about 2 hours. She experienced one episode of dizziness yesterday which resolved with rest and hydration. She reports one occurrence of sexual intercourse "with protection" one week ago.  Patient also endorses mild suprapubic cramping. This is a recurrent problem. Symptom onset: elective termination. Pain score is 3/10. She denies aggravating or alleviating factors. She has not taken medication or tried other treatments for this complaint.   OB History     Gravida  4   Para  2   Term  1   Preterm  1   AB  1   Living  2      SAB  0   IAB  1   Ectopic  0   Multiple  0   Live Births  2           Past Medical History:  Diagnosis Date   Bipolar affective disorder (HCC)    Complication of anesthesia    Mental disorder    bipolar, PTSD   Preeclampsia    PTSD (post-traumatic stress disorder)     Past Surgical History:  Procedure Laterality Date   WISDOM TOOTH EXTRACTION      Family History  Problem Relation Age of Onset   Diabetes Maternal Grandmother     Social History   Tobacco Use   Smoking status: Former    Types: Cigarettes    Quit date: 09/11/2015    Years since quitting: 5.8   Smokeless tobacco: Never  Vaping Use   Vaping Use: Never used  Substance Use Topics   Alcohol use: No   Drug use: No    Types: Marijuana    Allergies: No Known Allergies  No medications prior to admission.    Review of Systems   Genitourinary:  Positive for vaginal bleeding.  All other systems reviewed and are negative. Physical Exam   Blood pressure 118/73, pulse 84, temperature 98.8 F (37.1 C), resp. rate 18, height 5\' 6"  (1.676 m), weight 76.5 kg, last menstrual period 05/12/2021, SpO2 100 %.  Physical Exam Vitals and nursing note reviewed. Exam conducted with a chaperone present.  Constitutional:      Appearance: Normal appearance.  Cardiovascular:     Rate and Rhythm: Normal rate and regular rhythm.     Pulses: Normal pulses.     Heart sounds: Normal heart sounds.  Pulmonary:     Effort: Pulmonary effort is normal.     Breath sounds: Normal breath sounds.  Abdominal:     General: Abdomen is flat.     Tenderness: There is no abdominal tenderness. There is no right CVA tenderness or left CVA tenderness.  Skin:    Capillary Refill: Capillary refill takes less than 2 seconds.  Neurological:     Mental Status: She is alert and oriented to person, place, and time.  Psychiatric:        Mood and Affect: Mood normal.  Behavior: Behavior normal.        Thought Content: Thought content normal.        Judgment: Judgment normal.    MAU Course/MDM  Procedures  Patient Vitals for the past 24 hrs:  BP Temp Pulse Resp SpO2 Height Weight  07/24/21 1135 118/73 98.8 F (37.1 C) 84 18 100 % 5\' 6"  (1.676 m) 76.5 kg   Results for orders placed or performed during the hospital encounter of 07/24/21 (from the past 24 hour(s))  CBC     Status: None   Collection Time: 07/24/21 11:27 AM  Result Value Ref Range   WBC 5.9 4.0 - 10.5 K/uL   RBC 4.46 3.87 - 5.11 MIL/uL   Hemoglobin 12.0 12.0 - 15.0 g/dL   HCT 14/06/22 78.2 - 95.6 %   MCV 82.1 80.0 - 100.0 fL   MCH 26.9 26.0 - 34.0 pg   MCHC 32.8 30.0 - 36.0 g/dL   RDW 21.3 08.6 - 57.8 %   Platelets 157 150 - 400 K/uL   nRBC 0.0 0.0 - 0.2 %  hCG, quantitative, pregnancy     Status: Abnormal   Collection Time: 07/24/21 11:27 AM  Result Value Ref Range   hCG,  Beta Chain, Quant, S 476 (H) <5 mIU/mL  Type and screen [facility]     Status: None   Collection Time: 07/24/21 11:27 AM  Result Value Ref Range   ABO/RH(D) B POS    Antibody Screen NEG    Sample Expiration      07/27/2021,2359 Performed at [facility], [address] [place], [place]    [address] OB LESS THAN 14 WEEKS WITH US TRANSVAGINAL  Result Date: 07/24/2021 CLINICAL DATA:  Heavy vaginal bleeding. Misoprostol taken 06/29/2021. EXAM: OBSTETRIC <14 WK 13/06/2021 AND TRANSVAGINAL OB US TECHNIQUE: Both transabdominal and transvaginal ultrasound examinations were performed for complete evaluation of the gestation as well as the maternal uterus, adnexal regions, and pelvic cul-de-sac. Transvaginal technique was performed to assess early pregnancy. COMPARISON:  None. FINDINGS: Intrauterine gestational sac: None Yolk sac:  Not Visualized. Embryo:  Not Visualized. Cardiac Activity: Not Visualized. Heart Rate:   bpm MSD:   mm    w     d CRL:    mm    w    d                  [place] EDC: Subchorionic hemorrhage:  None visualized. Maternal uterus/adnexae: Area of focal thickening and heterogeneity within the endometrium in the fundus. Increased blood flow noted. Findings concerning for retained products of conception. No adnexal mass. Small amount of free fluid in the pelvis. IMPRESSION: No intrauterine gestation. Focally thickened, heterogeneous endometrium in the fundus concerning for retained products of conception. Electronically Signed   By: [place] M.D.   On: 07/24/2021 14:19    Assessment and Plan  --25 y.o. 30  --S/p elective termination 06/29/2021 --Quant hCG 476 --No other quant available for comparison --Will trend with repeat 48 hour stat Quant hCG in office --Hgb 12.0, Blood type B POS --Discharge home in stable condition  F/U: Appt made for stat Quant hCG with [facility] 12/08 at 1300 hours  14/08, MSA, MSN, CNM 07/24/2021, 7:23 PM

## 2021-07-24 NOTE — ED Provider Notes (Signed)
[place]    CSN: [id number] Arrival date & time: 07/24/21  0848      History   Chief Complaint Chief Complaint  Patient presents with   Vaginal Bleeding   Abdominal Pain    HPI Andrea Espinoza is a 25 y.o. female.   25 year old female presents with lower pelvic pain, increase in vaginal bleeding that has become worse last night. Her LMP was 05/12/21 and had a positive pregnancy test. She was seen at [facility] in [place] on 06/29/21. She was approximately 5 to [redacted] weeks along. She was given Misoprostol 2 doses for medical termination of her pregnancy. She continued to have heavy bleeding and had a follow-up appointment on Dec 14th but contacted [facility] due to worsening of symptoms. She now developed a low grade fever (100) yesterday and has felt more weak and dizzy. Last night she passed a large clot. She has not taken any other medications. She has not had any intercourse since the termination. No other chronic health issues. Takes no daily medication.   The history is provided by the patient.   Past Medical History:  Diagnosis Date   Bipolar affective disorder ([place])    Complication of anesthesia    Mental disorder    bipolar, PTSD   Preeclampsia    PTSD (post-traumatic stress disorder)     Patient Active Problem List   Diagnosis Date Noted   Postpartum care and examination 03/25/2019   Indication for care in labor or delivery 02/24/2019   Normal labor 02/24/2019   Vaginal delivery 02/24/2019   Postpartum hemorrhage 02/24/2019   Pyelectasis of fetus on prenatal ultrasound 12/09/2018   Supervision of high risk pregnancy, antepartum 08/18/2018   History of pre-eclampsia 08/18/2018   History of HELLP syndrome, currently pregnant 08/18/2018    Past Surgical History:  Procedure Laterality Date   WISDOM TOOTH EXTRACTION      OB History     Gravida  3   Para  2   Term  1   Preterm  1   AB  1   Living  2      SAB  0   IAB   1   Ectopic  0   Multiple  0   Live Births  2            Home Medications    Prior to Admission medications   Not on File    Family History Family History  Problem Relation Age of Onset   Diabetes Maternal Grandmother     Social History Social History   Tobacco Use   Smoking status: Former    Types: Cigarettes    Quit date: 09/11/2015    Years since quitting: 5.8   Smokeless tobacco: Never  Vaping Use   Vaping Use: Never used  Substance Use Topics   Alcohol use: No   Drug use: No    Types: Marijuana     Allergies   Patient has no known allergies.   Review of Systems Review of Systems  Constitutional:  Positive for fatigue and fever.  Respiratory:  Negative for chest tightness and shortness of breath.   Gastrointestinal:  Positive for abdominal pain and nausea. Negative for vomiting.  Genitourinary:  Positive for pelvic pain and vaginal bleeding. Negative for difficulty urinating, dysuria and flank pain.  Musculoskeletal:  Negative for arthralgias, back pain and myalgias.  Skin:  Negative for color change and rash.  Allergic/Immunologic: Negative for environmental allergies, food  allergies and immunocompromised state.  Neurological:  Positive for light-headedness. Negative for tremors, seizures and syncope.  Hematological:  Negative for adenopathy. Does not bruise/bleed easily.    Physical Exam Triage Vital Signs ED Triage Vitals  Enc Vitals Group     BP 07/24/21 0939 126/76     Pulse Rate 07/24/21 0939 100     Resp 07/24/21 0939 18     Temp 07/24/21 0939 99.8 F (37.7 C)     Temp Source 07/24/21 0939 Oral     SpO2 07/24/21 0939 98 %     Weight --      Height --      Head Circumference --      Peak Flow --      Pain Score 07/24/21 0938 0     Pain Loc --      Pain Edu? --      Excl. in GC? --    No data found.  Updated Vital Signs BP 126/76   Pulse 100   Temp 99.8 F (37.7 C) (Oral)   Resp 18   SpO2 98%   Breastfeeding No    Visual Acuity Right Eye Distance:   Left Eye Distance:   Bilateral Distance:    Right Eye Near:   Left Eye Near:    Bilateral Near:     Physical Exam Vitals and nursing note reviewed.  Constitutional:      General: She is awake. She is not in acute distress.    Appearance: She is well-developed and well-groomed.     Comments: She is lying down on the exam table and appears uncomfortable but stable.   HENT:     Head: Normocephalic and atraumatic.     Right Ear: Hearing normal.     Left Ear: Hearing normal.  Eyes:     Extraocular Movements: Extraocular movements intact.     Conjunctiva/sclera: Conjunctivae normal.  Cardiovascular:     Rate and Rhythm: Normal rate and regular rhythm.     Heart sounds: Normal heart sounds. No murmur heard. Pulmonary:     Effort: Pulmonary effort is normal. No respiratory distress.     Breath sounds: Normal breath sounds and air entry. No decreased air movement. No decreased breath sounds, wheezing, rhonchi or rales.  Abdominal:     General: Abdomen is flat.     Palpations: Abdomen is soft.     Tenderness: There is abdominal tenderness in the right lower quadrant and suprapubic area. There is no right CVA tenderness, left CVA tenderness, guarding or rebound.    Genitourinary:    Comments: Pelvic exam not performed since positive pregnancy test and she needs further evaluation at a higher level facility.  Skin:    General: Skin is warm and dry.  Neurological:     General: No focal deficit present.     Mental Status: She is alert and oriented to person, place, and time.  Psychiatric:        Mood and Affect: Mood normal.        Behavior: Behavior normal. Behavior is cooperative.        Thought Content: Thought content normal.        Judgment: Judgment normal.     UC Treatments / Results  Labs (all labs ordered are listed, but only abnormal results are displayed) Labs Reviewed  POC URINE PREG, ED - Abnormal; Notable for the following  components:      Result Value   Preg Test, Ur POSITIVE (*)  All other components within normal limits    EKG   Radiology No results found.  Procedures Procedures (including critical care time)  Medications Ordered in UC Medications - No data to display  Initial Impression / Assessment and Plan / UC Course  I have reviewed the triage vital signs and the nursing notes.  Pertinent labs & imaging results that were available during my care of the patient were reviewed by me and considered in my medical decision making (see chart for details).     Reviewed with patient continued positive pregnancy test. Test can still remain positive after medical termination of pregnancy but concern over incomplete termination. With increase in pelvic pain, increase in vaginal bleeding with clot and fever that developed yesterday, recommend patient go to [facility] [place] now. Patient understands and agrees with plan. Go to MAU now.  Final Clinical Impressions(s) / UC Diagnoses   Final diagnoses:  Vaginal bleeding  Pelvic pain  Positive pregnancy test     Discharge Instructions      Since you are continuing to have vaginal bleeding and an increase in abdominal pain and continue to have a positive urine pregnancy test after medical termination of a pregnancy with Misoprostol on 06/29/21, recommend further evaluation NOW at the hospital (MAU).     ED Prescriptions   None    PDMP not reviewed this encounter.   Katy Apo, NP 07/24/21 1106

## 2021-07-24 NOTE — Discharge Instructions (Addendum)
Since you are continuing to have vaginal bleeding and an increase in abdominal pain and continue to have a positive urine pregnancy test after medical termination of a pregnancy with Misoprostol on 06/29/21, recommend further evaluation NOW at the hospital (MAU).

## 2021-07-26 ENCOUNTER — Telehealth: Payer: Self-pay | Admitting: *Deleted

## 2021-07-26 ENCOUNTER — Encounter: Payer: Self-pay | Admitting: *Deleted

## 2021-07-26 ENCOUNTER — Other Ambulatory Visit: Payer: Self-pay

## 2021-07-26 ENCOUNTER — Ambulatory Visit (INDEPENDENT_AMBULATORY_CARE_PROVIDER_SITE_OTHER): Payer: Medicaid Other | Admitting: *Deleted

## 2021-07-26 VITALS — BP 104/69 | HR 76

## 2021-07-26 DIAGNOSIS — Z9889 Other specified postprocedural states: Secondary | ICD-10-CM | POA: Diagnosis not present

## 2021-07-26 NOTE — Telephone Encounter (Signed)
TC to discuss bHCG results. HIPPA compliant VM with call back number and hours of operation. MyChart message sent.

## 2021-07-26 NOTE — Progress Notes (Signed)
S:Andrea Espinoza presents for STAT bHCG, S/P IAB, episode of heavy bleeding with visit to MAU. Providers questioned resolving pregnancy vs new pregnancy per patient. O: Patient is alert and oriented X 4 and in no acute distress. VSS. A:Reports using approximately 4 super pads per day at this time. P:Dr. Alysia Penna consulted. Patient ok to check out. We will reviewed bHCG when it becomes available and let the patient know what the next steps are.

## 2021-07-27 LAB — BETA HCG QUANT (REF LAB): hCG Quant: 263 m[IU]/mL

## 2021-08-09 ENCOUNTER — Emergency Department (HOSPITAL_COMMUNITY)
Admission: EM | Admit: 2021-08-09 | Discharge: 2021-08-10 | Disposition: A | Discharge status: Home / Self Care | Payer: Medicaid Other | Attending: Emergency Medicine | Admitting: Emergency Medicine

## 2021-08-09 ENCOUNTER — Other Ambulatory Visit: Payer: Self-pay

## 2021-08-09 ENCOUNTER — Encounter (HOSPITAL_COMMUNITY): Payer: Self-pay

## 2021-08-09 DIAGNOSIS — N9489 Other specified conditions associated with female genital organs and menstrual cycle: Secondary | ICD-10-CM | POA: Diagnosis not present

## 2021-08-09 DIAGNOSIS — R1031 Right lower quadrant pain: Secondary | ICD-10-CM | POA: Insufficient documentation

## 2021-08-09 DIAGNOSIS — Z5321 Procedure and treatment not carried out due to patient leaving prior to being seen by health care provider: Secondary | ICD-10-CM | POA: Insufficient documentation

## 2021-08-09 LAB — CBC
HCT: 35.5 % — ABNORMAL LOW (ref 36.0–46.0)
Hemoglobin: 11.4 g/dL — ABNORMAL LOW (ref 12.0–15.0)
MCH: 26.5 pg (ref 26.0–34.0)
MCHC: 32.1 g/dL (ref 30.0–36.0)
MCV: 82.4 fL (ref 80.0–100.0)
Platelets: 244 10*3/uL (ref 150–400)
RBC: 4.31 MIL/uL (ref 3.87–5.11)
RDW: 14.2 % (ref 11.5–15.5)
WBC: 8.7 10*3/uL (ref 4.0–10.5)
nRBC: 0 % (ref 0.0–0.2)

## 2021-08-09 LAB — COMPREHENSIVE METABOLIC PANEL
ALT: 10 U/L (ref 0–44)
AST: 15 U/L (ref 15–41)
Albumin: 4.1 g/dL (ref 3.5–5.0)
Alkaline Phosphatase: 54 U/L (ref 38–126)
Anion gap: 6 (ref 5–15)
BUN: 14 mg/dL (ref 6–20)
CO2: 26 mmol/L (ref 22–32)
Calcium: 9.1 mg/dL (ref 8.9–10.3)
Chloride: 107 mmol/L (ref 98–111)
Creatinine, Ser: 0.88 mg/dL (ref 0.44–1.00)
GFR, Estimated: >60 mL/min (ref >60)
Glucose, Bld: 100 mg/dL — ABNORMAL HIGH (ref 70–99)
Potassium: 4.7 mmol/L (ref 3.5–5.1)
Sodium: 139 mmol/L (ref 135–145)
Total Bilirubin: 0.5 mg/dL (ref 0.3–1.2)
Total Protein: 7.3 g/dL (ref 6.5–8.1)

## 2021-08-09 LAB — I-STAT BETA HCG BLOOD, ED (MC, WL, AP ONLY): I-stat hCG, quantitative: <5 m[IU]/mL (ref <5)

## 2021-08-09 LAB — LIPASE, BLOOD: Lipase: 33 U/L (ref 11–51)

## 2021-08-09 NOTE — ED Triage Notes (Signed)
Pt BIB EMS from home. Pt c/o right lower abdominal pain x2 days. Pt states pain is intermittent. Pt states she had an abortion late last month, had an IUD placed 12/14. Pt states she has not had a menstrual cycle since September. Pt reports OTC meds and heat/ice give no relief. Pt has no hx of gallbladder or appendix issues.

## 2022-04-28 ENCOUNTER — Telehealth: Payer: Medicaid Other | Admitting: Physician Assistant

## 2022-04-28 DIAGNOSIS — H66002 Acute suppurative otitis media without spontaneous rupture of ear drum, left ear: Secondary | ICD-10-CM | POA: Diagnosis not present

## 2022-04-28 MED ORDER — AMOXICILLIN 500 MG PO CAPS: 500.0000 mg | ORAL_CAPSULE | Freq: Two times a day (BID) | ORAL | 0 refills | Status: AC

## 2022-04-28 MED ORDER — NEOMYCIN-POLYMYXIN-HC 3.5-10000-1 OT SOLN: 3.0000 [drp] | Freq: Four times a day (QID) | OTIC | 0 refills | Status: DC

## 2022-04-28 NOTE — Patient Instructions (Signed)
[name], thank you for joining [name], PA-C for today's virtual visit.  While this provider is not your primary care provider (PCP), if your PCP is located in our provider database this encounter information will be shared with them immediately following your visit.  Consent: (Patient) Andrea Espinoza provided verbal consent for this virtual visit at the beginning of the encounter.  Current Medications:  Current Outpatient Medications:    amoxicillin (AMOXIL) 500 MG capsule, Take 1 capsule (500 mg total) by mouth 2 (two) times daily for 10 days., Disp: 20 capsule, Rfl: 0   neomycin-polymyxin-hydrocortisone (CORTISPORIN) OTIC solution, Place 3 drops into the left ear 4 (four) times daily. X 7 days, Disp: 10 mL, Rfl: 0   Medications ordered in this encounter:  Meds ordered this encounter  Medications   amoxicillin (AMOXIL) 500 MG capsule    Sig: Take 1 capsule (500 mg total) by mouth 2 (two) times daily for 10 days.    Dispense:  20 capsule    Refill:  0    Order Specific Question:   Supervising Provider    Answer:   Merrilee Jansky [id number]   neomycin-polymyxin-hydrocortisone (CORTISPORIN) OTIC solution    Sig: Place 3 drops into the left ear 4 (four) times daily. X 7 days    Dispense:  10 mL    Refill:  0    Order Specific Question:   Supervising Provider    Answer:   Merrilee Jansky [[id number]]     *If you need refills on other medications prior to your next appointment, please contact your pharmacy*  Follow-Up: Call back or seek an in-person evaluation if the symptoms worsen or if the condition fails to improve as anticipated.  Other Instructions Otitis Media, Adult  Otitis media occurs when there is inflammation and fluid in the middle ear with signs and symptoms of an acute infection. The middle ear is a part of the ear that contains bones for hearing as well as air that helps send sounds to the brain. When infected fluid builds up in this space, it causes  pressure and can lead to an ear infection. The eustachian tube connects the middle ear to the back of the nose (nasopharynx) and normally allows air into the middle ear. If the eustachian tube becomes blocked, fluid can build up and become infected. What are the causes? This condition is caused by a blockage in the eustachian tube. This can be caused by mucus or by swelling of the tube. Problems that can cause a blockage include: A cold or other upper respiratory infection. Allergies. An irritant, such as tobacco smoke. Enlarged adenoids. The adenoids are areas of soft tissue located high in the back of the throat, behind the nose and the roof of the mouth. They are part of the body's defense system (immune system). A mass in the nasopharynx. Damage to the ear caused by pressure changes (barotrauma). What increases the risk? You are more likely to develop this condition if you: Smoke or are exposed to tobacco smoke. Have an opening in the roof of your mouth (cleft palate). Have gastroesophageal reflux. Have an immune system disorder. What are the signs or symptoms? Symptoms of this condition include: Ear pain. Fever. Decreased hearing. Tiredness (lethargy). Fluid leaking from the ear, if the eardrum is ruptured or has burst. Ringing in the ear. How is this diagnosed?  This condition is diagnosed with a physical exam. During the exam, your health care provider will use an instrument  called an otoscope to look in your ear and check for redness, swelling, and fluid. He or she will also ask about your symptoms. Your health care provider may also order tests, such as: A pneumatic otoscopy. This is a test to check the movement of the eardrum. It is done by squeezing a small amount of air into the ear. A tympanogram. This is a test that shows how well the eardrum moves in response to air pressure in the ear canal. It provides a graph for your health care provider to review. How is this  treated? This condition can go away on its own within 3-5 days. But if the condition is caused by a bacterial infection and does not go away on its own, or if it keeps coming back, your health care provider may: Prescribe antibiotic medicine to treat the infection. Prescribe or recommend medicines to control pain. Follow these instructions at home: Take over-the-counter and prescription medicines only as told by your health care provider. If you were prescribed an antibiotic medicine, take it as told by your health care provider. Do not stop taking the antibiotic even if you start to feel better. Keep all follow-up visits. This is important. Contact a health care provider if: You have bleeding from your nose. There is a lump on your neck. You are not feeling better in 5 days. You feel worse instead of better. Get help right away if: You have severe pain that is not controlled with medicine. You have swelling, redness, or pain around your ear. You have stiffness in your neck. A part of your face is not moving (paralyzed). The bone behind your ear (mastoid bone) is tender when you touch it. You develop a severe headache. Summary Otitis media is redness, soreness, and swelling of the middle ear, usually resulting in pain and decreased hearing. This condition can go away on its own within 3-5 days. If the problem does not go away in 3-5 days, your health care provider may give you medicines to treat the infection. If you were prescribed an antibiotic medicine, take it as told by your health care provider. Follow all instructions that were given to you by your health care provider. This information is not intended to replace advice given to you by your health care provider. Make sure you discuss any questions you have with your health care provider. Document Revised: 11/13/2020 Document Reviewed: 11/13/2020 Elsevier Patient Education  2023 Elsevier Inc.    If you have been instructed to  have an in-person evaluation today at a local Urgent Care facility, please use the link below. It will take you to a list of all of our available [facility], including address, phone number and hours of operation. Please do not delay care.  [facility]  If you or a family member do not have a primary care provider, use the link below to schedule a visit and establish care. When you choose a [facility] primary care physician or advanced practice provider, you gain a long-term partner in health. Find a Primary Care Provider  Learn more about [facility]'s in-office and virtual care options: [facility] - Get Care Now

## 2022-04-28 NOTE — Progress Notes (Signed)
Virtual Visit Consent   Andrea Espinoza, you are scheduled for a virtual visit with a [facility] provider today. Just as with appointments in the office, your consent must be obtained to participate. Your consent will be active for this visit and any virtual visit you may have with one of our providers in the next 365 days. If you have a MyChart account, a copy of this consent can be sent to you electronically.  As this is a virtual visit, video technology does not allow for your provider to perform a traditional examination. This may limit your provider's ability to fully assess your condition. If your provider identifies any concerns that need to be evaluated in person or the need to arrange testing (such as labs, EKG, etc.), we will make arrangements to do so. Although advances in technology are sophisticated, we cannot ensure that it will always work on either your end or our end. If the connection with a video visit is poor, the visit may have to be switched to a telephone visit. With either a video or telephone visit, we are not always able to ensure that we have a secure connection.  By engaging in this virtual visit, you consent to the provision of healthcare and authorize for your insurance to be billed (if applicable) for the services provided during this visit. Depending on your insurance coverage, you may receive a charge related to this service.  I need to obtain your verbal consent now. Are you willing to proceed with your visit today? Andrea Espinoza has provided verbal consent on 04/28/2022 for a virtual visit (video or telephone). [name], PA-C  Date: 04/28/2022 8:37 AM  Virtual Visit via Video Note   I, [name], connected with  Andrea Espinoza  ([id number], Nov 22, 1995) on 04/28/22 at  8:30 AM EDT by a video-enabled telemedicine application and verified that I am speaking with the correct person using two identifiers.  Location: Patient: Virtual Visit Location Patient:  Home Provider: Virtual Visit Location Provider: Home Office   I discussed the limitations of evaluation and management by telemedicine and the availability of in person appointments. The patient expressed understanding and agreed to proceed.    History of Present Illness: Andrea Espinoza is a 26 y.o. who identifies as a female who was assigned female at birth, and is being seen today for sore throat.  HPI: Sore Throat  This is a new problem. The current episode started 1 to 4 weeks ago (off and on URI symptoms over 3 weeks). The problem has been gradually worsening. There has been no fever. Associated symptoms include congestion (a couple of days ago, now improved), coughing (mild), ear pain (left), a plugged ear sensation and swollen glands. Pertinent negatives include no ear discharge, headaches, hoarse voice, neck pain, shortness of breath or trouble swallowing. She has had no exposure to strep or mono. She has tried acetaminophen for the symptoms. The treatment provided no relief.      Problems:  Patient Active Problem List   Diagnosis Date Noted   Postpartum care and examination 03/25/2019   Indication for care in labor or delivery 02/24/2019   Normal labor 02/24/2019   Vaginal delivery 02/24/2019   Postpartum hemorrhage 02/24/2019   Pyelectasis of fetus on prenatal ultrasound 12/09/2018   Supervision of high risk pregnancy, antepartum 08/18/2018   History of pre-eclampsia 08/18/2018   History of HELLP syndrome, currently pregnant 08/18/2018    Allergies: No Known Allergies Medications:  Current Outpatient Medications:  amoxicillin (AMOXIL) 500 MG capsule, Take 1 capsule (500 mg total) by mouth 2 (two) times daily for 10 days., Disp: 20 capsule, Rfl: 0   neomycin-polymyxin-hydrocortisone (CORTISPORIN) OTIC solution, Place 3 drops into the left ear 4 (four) times daily. X 7 days, Disp: 10 mL, Rfl: 0  Observations/Objective: Patient is well-developed, well-nourished in no acute  distress.  Resting comfortably at home.  Head is normocephalic, atraumatic.  No labored breathing.  Speech is clear and coherent with logical content.  Patient is alert and oriented at baseline.    Assessment and Plan: 1. Non-recurrent acute suppurative otitis media of left ear without spontaneous rupture of tympanic membrane - amoxicillin (AMOXIL) 500 MG capsule; Take 1 capsule (500 mg total) by mouth 2 (two) times daily for 10 days.  Dispense: 20 capsule; Refill: 0 - neomycin-polymyxin-hydrocortisone (CORTISPORIN) OTIC solution; Place 3 drops into the left ear 4 (four) times daily. X 7 days  Dispense: 10 mL; Refill: 0  - Worsening symptoms that have not responded to OTC medications.  - Will give Amoxicillin and Cortisporin ear drops - Steam and humidifier can help - Stay well hydrated and get plenty of rest.  - Seek in person evaluation if no symptom improvement or if symptoms worsen   Follow Up Instructions: I discussed the assessment and treatment plan with the patient. The patient was provided an opportunity to ask questions and all were answered. The patient agreed with the plan and demonstrated an understanding of the instructions.  A copy of instructions were sent to the patient via MyChart unless otherwise noted below.    The patient was advised to call back or seek an in-person evaluation if the symptoms worsen or if the condition fails to improve as anticipated.  Time:  I spent 10 minutes with the patient via telehealth technology discussing the above problems/concerns.    [name], PA-C

## 2022-07-29 ENCOUNTER — Telehealth: Payer: Medicaid Other | Admitting: Physician Assistant

## 2022-07-29 DIAGNOSIS — B3731 Acute candidiasis of vulva and vagina: Secondary | ICD-10-CM

## 2022-07-29 MED ORDER — TERCONAZOLE 0.4 % VA CREA: TOPICAL_CREAM | VAGINAL | 0 refills | Status: DC

## 2022-07-29 MED ORDER — FLUCONAZOLE 200 MG PO TABS: 200.0000 mg | ORAL_TABLET | ORAL | 0 refills | Status: DC | PRN

## 2022-07-29 NOTE — Progress Notes (Signed)
Virtual Visit Consent   Andrea Espinoza, you are scheduled for a virtual visit with a [facility] provider today. Just as with appointments in the office, your consent must be obtained to participate. Your consent will be active for this visit and any virtual visit you may have with one of our providers in the next 365 days. If you have a MyChart account, a copy of this consent can be sent to you electronically.  As this is a virtual visit, video technology does not allow for your provider to perform a traditional examination. This may limit your provider's ability to fully assess your condition. If your provider identifies any concerns that need to be evaluated in person or the need to arrange testing (such as labs, EKG, etc.), we will make arrangements to do so. Although advances in technology are sophisticated, we cannot ensure that it will always work on either your end or our end. If the connection with a video visit is poor, the visit may have to be switched to a telephone visit. With either a video or telephone visit, we are not always able to ensure that we have a secure connection.  By engaging in this virtual visit, you consent to the provision of healthcare and authorize for your insurance to be billed (if applicable) for the services provided during this visit. Depending on your insurance coverage, you may receive a charge related to this service.  I need to obtain your verbal consent now. Are you willing to proceed with your visit today? Andrea Espinoza has provided verbal consent on 07/29/2022 for a virtual visit (video or telephone). Andrea Loveless, PA-C  Date: 07/29/2022 5:38 PM  Virtual Visit via Video Note   I, Andrea Espinoza, connected with  Andrea Espinoza  ([id number], 07-22-1996) on 07/29/22 at  5:30 PM EST by a video-enabled telemedicine application and verified that I am speaking with the correct person using two identifiers.  Location: Patient: Virtual Visit Location  Patient: Home Provider: Virtual Visit Location Provider: Home Office   I discussed the limitations of evaluation and management by telemedicine and the availability of in person appointments. The patient expressed understanding and agreed to proceed.    History of Present Illness: Andrea Espinoza is a 26 y.o. who identifies as a female who was assigned female at birth, and is being seen today for vaginal itching.  HPI: Vaginal Itching The patient's primary symptoms include genital itching. The patient's pertinent negatives include no genital lesions, genital odor, genital rash, vaginal bleeding or vaginal discharge. This is a new problem. The current episode started in the past 7 days (Friday). The problem occurs constantly. The problem has been gradually worsening. The patient is experiencing no pain. She is not pregnant. Pertinent negatives include no abdominal pain, chills, dysuria, fever, flank pain, frequency or nausea. Nothing aggravates the symptoms. She has tried nothing for the symptoms. The treatment provided no relief.    Problems:  Patient Active Problem List   Diagnosis Date Noted   Postpartum care and examination 03/25/2019   Indication for care in labor or delivery 02/24/2019   Normal labor 02/24/2019   Vaginal delivery 02/24/2019   Postpartum hemorrhage 02/24/2019   Pyelectasis of fetus on prenatal ultrasound 12/09/2018   Supervision of high risk pregnancy, antepartum 08/18/2018   History of pre-eclampsia 08/18/2018   History of HELLP syndrome, currently pregnant 08/18/2018    Allergies: No Known Allergies Medications:  Current Outpatient Medications:    fluconazole (DIFLUCAN) 200 MG tablet, Take 1  tablet (200 mg total) by mouth every 3 (three) days as needed., Disp: 2 tablet, Rfl: 0   terconazole (TERAZOL 7) 0.4 % vaginal cream, Apply about 1 inch to external tissues daily at bedtime for up to 7 days, Disp: 45 g, Rfl: 0   neomycin-polymyxin-hydrocortisone (CORTISPORIN) OTIC  solution, Place 3 drops into the left ear 4 (four) times daily. X 7 days, Disp: 10 mL, Rfl: 0  Observations/Objective: Patient is well-developed, well-nourished in no acute distress.  Resting comfortably at home.  Head is normocephalic, atraumatic.  No labored breathing.  Speech is clear and coherent with logical content.  Patient is alert and oriented at baseline.    Assessment and Plan: 1. Yeast vaginitis - fluconazole (DIFLUCAN) 200 MG tablet; Take 1 tablet (200 mg total) by mouth every 3 (three) days as needed.  Dispense: 2 tablet; Refill: 0 - terconazole (TERAZOL 7) 0.4 % vaginal cream; Apply about 1 inch to external tissues daily at bedtime for up to 7 days  Dispense: 45 g; Refill: 0  - Symptoms consistent with yeast vaginitis - Diflucan and terconazole prescribed - Limit bubble baths, scented lotions/soaps/detergents - Limit tight fitting clothing - Seek on person evaluation if not improving or if symptoms worsen   Follow Up Instructions: I discussed the assessment and treatment plan with the patient. The patient was provided an opportunity to ask questions and all were answered. The patient agreed with the plan and demonstrated an understanding of the instructions.  A copy of instructions were sent to the patient via MyChart unless otherwise noted below.    The patient was advised to call back or seek an in-person evaluation if the symptoms worsen or if the condition fails to improve as anticipated.  Time:  I spent 8 minutes with the patient via telehealth technology discussing the above problems/concerns.    Mar Daring, PA-C

## 2022-07-29 NOTE — Patient Instructions (Signed)
[name], thank you for joining [name], PA-C for today's virtual visit.  While this provider is not your primary care provider (PCP), if your PCP is located in our provider database this encounter information will be shared with them immediately following your visit.   A [facility] MyChart account gives you access to today's visit and all your visits, tests, and labs performed at [facility] " click here if you don't have a [facility] MyChart account or go to [email or web address]  Consent: (Patient) Andrea Espinoza provided verbal consent for this virtual visit at the beginning of the encounter.  Current Medications:  Current Outpatient Medications:    fluconazole (DIFLUCAN) 200 MG tablet, Take 1 tablet (200 mg total) by mouth every 3 (three) days as needed., Disp: 2 tablet, Rfl: 0   terconazole (TERAZOL 7) 0.4 % vaginal cream, Apply about 1 inch to external tissues daily at bedtime for up to 7 days, Disp: 45 g, Rfl: 0   neomycin-polymyxin-hydrocortisone (CORTISPORIN) OTIC solution, Place 3 drops into the left ear 4 (four) times daily. X 7 days, Disp: 10 mL, Rfl: 0   Medications ordered in this encounter:  Meds ordered this encounter  Medications   fluconazole (DIFLUCAN) 200 MG tablet    Sig: Take 1 tablet (200 mg total) by mouth every 3 (three) days as needed.    Dispense:  2 tablet    Refill:  0    Order Specific Question:   Supervising Provider    Answer:   Merrilee Jansky [[id number]]   terconazole (TERAZOL 7) 0.4 % vaginal cream    Sig: Apply about 1 inch to external tissues daily at bedtime for up to 7 days    Dispense:  45 g    Refill:  0    Order Specific Question:   Supervising Provider    Answer:   Merrilee Jansky [[id number]]     *If you need refills on other medications prior to your next appointment, please contact your pharmacy*  Follow-Up: Call back or seek an in-person evaluation if the symptoms worsen or if the condition fails to  improve as anticipated.  [facility] [phone]  Other Instructions Vaginal Yeast Infection, Adult  Vaginal yeast infection is a condition that causes vaginal discharge as well as soreness, swelling, and redness (inflammation) of the vagina. This is a common condition. Some women get this infection frequently. What are the causes? This condition is caused by a change in the normal balance of the yeast (Candida) and normal bacteria that live in the vagina. This change causes an overgrowth of yeast, which causes the inflammation. What increases the risk? The condition is more likely to develop in women who: Take antibiotic medicines. Have diabetes. Take birth control pills. Are pregnant. Douche often. Have a weak body defense system (immune system). Have been taking steroid medicines for a long time. Frequently wear tight clothing. What are the signs or symptoms? Symptoms of this condition include: White, thick, creamy vaginal discharge. Swelling, itching, redness, and irritation of the vagina. The lips of the vagina (labia) may be affected as well. Pain or a burning feeling while urinating. Pain during sex. How is this diagnosed? This condition is diagnosed based on: Your medical history. A physical exam. A pelvic exam. Your health care provider will examine a sample of your vaginal discharge under a microscope. Your health care provider may send this sample for testing to confirm the diagnosis. How is this treated? This condition  is treated with medicine. Medicines may be over-the-counter or prescription. You may be told to use one or more of the following: Medicine that is taken by mouth (orally). Medicine that is applied as a cream (topically). Medicine that is inserted directly into the vagina (suppository). Follow these instructions at home: Take or apply over-the-counter and prescription medicines only as told by your health care provider. Do not use  tampons until your health care provider approves. Do not have sex until your infection has cleared. Sex can prolong or worsen your symptoms of infection. Ask your health care provider when it is safe to resume sexual activity. Keep all follow-up visits. This is important. How is this prevented?  Do not wear tight clothes, such as pantyhose or tight pants. Wear breathable cotton underwear. Do not use douches, perfumed soap, creams, or powders. Wipe from front to back after using the toilet. If you have diabetes, keep your blood sugar levels under control. Ask your health care provider for other ways to prevent yeast infections. Contact a health care provider if: You have a fever. Your symptoms go away and then return. Your symptoms do not get better with treatment. Your symptoms get worse. You have new symptoms. You develop blisters in or around your vagina. You have blood coming from your vagina and it is not your menstrual period. You develop pain in your abdomen. Summary Vaginal yeast infection is a condition that causes discharge as well as soreness, swelling, and redness (inflammation) of the vagina. This condition is treated with medicine. Medicines may be over-the-counter or prescription. Take or apply over-the-counter and prescription medicines only as told by your health care provider. Do not douche. Resume sexual activity or use of tampons as instructed by your health care provider. Contact a health care provider if your symptoms do not get better with treatment or your symptoms go away and then return. This information is not intended to replace advice given to you by your health care provider. Make sure you discuss any questions you have with your health care provider. Document Revised: 10/23/2020 Document Reviewed: 10/23/2020 Elsevier Patient Education  2023 Elsevier Inc.    If you have been instructed to have an in-person evaluation today at a local Urgent Care facility,  please use the link below. It will take you to a list of all of our available [facility], including address, phone number and hours of operation. Please do not delay care.  [facility]  If you or a family member do not have a primary care provider, use the link below to schedule a visit and establish care. When you choose a [facility] primary care physician or advanced practice provider, you gain a long-term partner in health. Find a Primary Care Provider  Learn more about [facility]'s in-office and virtual care options: [facility] - Get Care Now

## 2022-08-09 ENCOUNTER — Telehealth: Payer: Medicaid Other | Admitting: Emergency Medicine

## 2022-08-09 DIAGNOSIS — N760 Acute vaginitis: Secondary | ICD-10-CM | POA: Diagnosis not present

## 2022-08-09 DIAGNOSIS — B9689 Other specified bacterial agents as the cause of diseases classified elsewhere: Secondary | ICD-10-CM

## 2022-08-09 MED ORDER — METRONIDAZOLE 500 MG PO TABS: 500.0000 mg | ORAL_TABLET | Freq: Two times a day (BID) | ORAL | 0 refills | Status: AC

## 2022-08-09 NOTE — Patient Instructions (Signed)
  Andrea Espinoza, thank you for joining Andrea Parsons, NP for today's virtual visit.  While this provider is not your primary care provider (PCP), if your PCP is located in our provider database this encounter information will be shared with them immediately following your visit.   A [facility] MyChart account gives you access to today's visit and all your visits, tests, and labs performed at [facility] " click here if you don't have a [facility] MyChart account or go to [email or web address]  Consent: (Patient) Andrea Espinoza provided verbal consent for this virtual visit at the beginning of the encounter.  Current Medications:  Current Outpatient Medications:    metroNIDAZOLE (FLAGYL) 500 MG tablet, Take 1 tablet (500 mg total) by mouth 2 (two) times daily for 7 days., Disp: 14 tablet, Rfl: 0   fluconazole (DIFLUCAN) 200 MG tablet, Take 1 tablet (200 mg total) by mouth every 3 (three) days as needed., Disp: 2 tablet, Rfl: 0   neomycin-polymyxin-hydrocortisone (CORTISPORIN) OTIC solution, Place 3 drops into the left ear 4 (four) times daily. X 7 days, Disp: 10 mL, Rfl: 0   terconazole (TERAZOL 7) 0.4 % vaginal cream, Apply about 1 inch to external tissues daily at bedtime for up to 7 days, Disp: 45 g, Rfl: 0   Medications ordered in this encounter:  Meds ordered this encounter  Medications   metroNIDAZOLE (FLAGYL) 500 MG tablet    Sig: Take 1 tablet (500 mg total) by mouth 2 (two) times daily for 7 days.    Dispense:  14 tablet    Refill:  0     *If you need refills on other medications prior to your next appointment, please contact your pharmacy*  Follow-Up: Call back or seek an in-person evaluation if the symptoms worsen or if the condition fails to improve as anticipated.  [facility] [phone]  Other Instructions Andrea Espinoza all medication as prescribed.  If your symptoms do not improve with this treatment, you will need to be seen somewhere in person  such as the health department.  Consider using condoms with your partner for a while to see if that helps prevent symptoms and helps your vaginal health recover.   If you have been instructed to have an in-person evaluation today at a local Urgent Care facility, please use the link below. It will take you to a list of all of our available [facility], including address, phone number and hours of operation. Please do not delay care.  [facility]  If you or a family member do not have a primary care provider, use the link below to schedule a visit and establish care. When you choose a [name] primary care physician or advanced practice provider, you gain a long-term partner in health. Find a Primary Care Provider  Learn more about [facility]'s in-office and virtual care options: [facility] - Get Care Now

## 2022-08-09 NOTE — Progress Notes (Signed)
Virtual Visit Consent   Andrea Espinoza, you are scheduled for a virtual visit with a [facility] provider today. Just as with appointments in the office, your consent must be obtained to participate. Your consent will be active for this visit and any virtual visit you may have with one of our providers in the next 365 days. If you have a MyChart account, a copy of this consent can be sent to you electronically.  As this is a virtual visit, video technology does not allow for your provider to perform a traditional examination. This may limit your provider's ability to fully assess your condition. If your provider identifies any concerns that need to be evaluated in person or the need to arrange testing (such as labs, EKG, etc.), we will make arrangements to do so. Although advances in technology are sophisticated, we cannot ensure that it will always work on either your end or our end. If the connection with a video visit is poor, the visit may have to be switched to a telephone visit. With either a video or telephone visit, we are not always able to ensure that we have a secure connection.  By engaging in this virtual visit, you consent to the provision of healthcare and authorize for your insurance to be billed (if applicable) for the services provided during this visit. Depending on your insurance coverage, you may receive a charge related to this service.  I need to obtain your verbal consent now. Are you willing to proceed with your visit today? Andrea Espinoza has provided verbal consent on 08/09/2022 for a virtual visit (video or telephone). Andrea Parsons, NP  Date: 08/09/2022 10:36 AM  Virtual Visit via Video Note   I, Andrea Espinoza, connected with  Andrea Espinoza  ([id number], July 08, 1996) on 08/09/22 at 10:30 AM EST by a video-enabled telemedicine application and verified that I am speaking with the correct person using two identifiers.  Location: Patient: Virtual Visit Location Patient:  Home Provider: Virtual Visit Location Provider: Home Office   I discussed the limitations of evaluation and management by telemedicine and the availability of in person appointments. The patient expressed understanding and agreed to proceed.    History of Present Illness: Andrea Espinoza is a 26 y.o. who identifies as a female who was assigned female at birth, and is being seen today for vaginal discharge.  Patient reports 3 to 4 days of clear, foul-smelling vaginal discharge.  It does have a fishy odor.  She had something similar about a year ago.  She was seen at the health department and told she had BV.  She was treated but symptoms have now returned about a year later.  Denies dysuria, hematuria, abdominal pain, flank pain, nausea, vomiting.  She has 1 partner.  her last period was about a week ago  HPI: HPI  Problems:  Patient Active Problem List   Diagnosis Date Noted   Postpartum care and examination 03/25/2019   Indication for care in labor or delivery 02/24/2019   Normal labor 02/24/2019   Vaginal delivery 02/24/2019   Postpartum hemorrhage 02/24/2019   Pyelectasis of fetus on prenatal ultrasound 12/09/2018   Supervision of high risk pregnancy, antepartum 08/18/2018   History of pre-eclampsia 08/18/2018   History of HELLP syndrome, currently pregnant 08/18/2018    Allergies: No Known Allergies Medications:  Current Outpatient Medications:    metroNIDAZOLE (FLAGYL) 500 MG tablet, Take 1 tablet (500 mg total) by mouth 2 (two) times daily for 7 days., Disp: 14  tablet, Rfl: 0   fluconazole (DIFLUCAN) 200 MG tablet, Take 1 tablet (200 mg total) by mouth every 3 (three) days as needed., Disp: 2 tablet, Rfl: 0   neomycin-polymyxin-hydrocortisone (CORTISPORIN) OTIC solution, Place 3 drops into the left ear 4 (four) times daily. X 7 days, Disp: 10 mL, Rfl: 0   terconazole (TERAZOL 7) 0.4 % vaginal cream, Apply about 1 inch to external tissues daily at bedtime for up to 7 days, Disp: 45 g,  Rfl: 0  Observations/Objective: Patient is well-developed, well-nourished in no acute distress.  Resting comfortably  at home.  Head is normocephalic, atraumatic.  No labored breathing.  Speech is clear and coherent with logical content.  Patient is alert and oriented at baseline.    Assessment and Plan: 1. Bacterial vaginosis  Had a long discussion about causes and prevention of BV.  Follow Up Instructions: I discussed the assessment and treatment plan with the patient. The patient was provided an opportunity to ask questions and all were answered. The patient agreed with the plan and demonstrated an understanding of the instructions.  A copy of instructions were sent to the patient via MyChart unless otherwise noted below.    The patient was advised to call back or seek an in-person evaluation if the symptoms worsen or if the condition fails to improve as anticipated.  Time:  I spent 12 minutes with the patient via telehealth technology discussing the above problems/concerns.    Andrea Parsons, NP

## 2022-08-19 ENCOUNTER — Telehealth: Payer: Medicaid Other | Admitting: Nurse Practitioner

## 2022-08-19 DIAGNOSIS — B9689 Other specified bacterial agents as the cause of diseases classified elsewhere: Secondary | ICD-10-CM

## 2022-08-19 DIAGNOSIS — N760 Acute vaginitis: Secondary | ICD-10-CM

## 2022-08-19 MED ORDER — METRONIDAZOLE 500 MG PO TABS: 500.0000 mg | ORAL_TABLET | Freq: Two times a day (BID) | ORAL | 0 refills | Status: AC

## 2022-08-19 NOTE — Progress Notes (Signed)
Virtual Visit Consent   [name], you are scheduled for a virtual visit with a [place] provider today. Just as with appointments in the office, your consent must be obtained to participate. Your consent will be active for this visit and any virtual visit you may have with one of our providers in the next 365 days. If you have a MyChart account, a copy of this consent can be sent to you electronically.  As this is a virtual visit, video technology does not allow for your provider to perform a traditional examination. This may limit your provider's ability to fully assess your condition. If your provider identifies any concerns that need to be evaluated in person or the need to arrange testing (such as labs, EKG, etc.), we will make arrangements to do so. Although advances in technology are sophisticated, we cannot ensure that it will always work on either your end or our end. If the connection with a video visit is poor, the visit may have to be switched to a telephone visit. With either a video or telephone visit, we are not always able to ensure that we have a secure connection.  By engaging in this virtual visit, you consent to the provision of healthcare and authorize for your insurance to be billed (if applicable) for the services provided during this visit. Depending on your insurance coverage, you may receive a charge related to this service.  I need to obtain your verbal consent now. Are you willing to proceed with your visit today? Brianne Maina has provided verbal consent on 08/19/2022 for a virtual visit (video or telephone). Apolonio Schneiders, FNP  Date: 08/19/2022 1:15 PM  Virtual Visit via Video Note   I, Apolonio Schneiders, connected with  Dainelle Hun  ([id number], 07/12/1996) on 08/19/22 at  1:15 PM EST by a video-enabled telemedicine application and verified that I am speaking with the correct person using two identifiers.  Location: Patient: Virtual Visit Location Patient: Home Provider:  Virtual Visit Location Provider: Home Office   I discussed the limitations of evaluation and management by telemedicine and the availability of in person appointments. The patient expressed understanding and agreed to proceed.    History of Present Illness: Andrea Espinoza is a 27 y.o. who identifies as a female who was assigned female at birth, and is being seen today for follow up on BV. She was prescribed Flagyl just prior to traveling for the holidays. She took one pill and then left the bottle while traveling and has been unable to follow up since she was out of state.   She is returning to [place] today and requesting to restart Flagyl as she continues to have symptoms.    Problems:  Patient Active Problem List   Diagnosis Date Noted   Postpartum care and examination 03/25/2019   Indication for care in labor or delivery 02/24/2019   Normal labor 02/24/2019   Vaginal delivery 02/24/2019   Postpartum hemorrhage 02/24/2019   Pyelectasis of fetus on prenatal ultrasound 12/09/2018   Supervision of high risk pregnancy, antepartum 08/18/2018   History of pre-eclampsia 08/18/2018   History of HELLP syndrome, currently pregnant 08/18/2018    Allergies: No Known Allergies Medications:  Current Outpatient Medications:    fluconazole (DIFLUCAN) 200 MG tablet, Take 1 tablet (200 mg total) by mouth every 3 (three) days as needed., Disp: 2 tablet, Rfl: 0   neomycin-polymyxin-hydrocortisone (CORTISPORIN) OTIC solution, Place 3 drops into the left ear 4 (four) times daily. X 7 days, Disp: 10  mL, Rfl: 0   terconazole (TERAZOL 7) 0.4 % vaginal cream, Apply about 1 inch to external tissues daily at bedtime for up to 7 days, Disp: 45 g, Rfl: 0  Observations/Objective: Patient is well-developed, well-nourished in no acute distress.  Resting comfortably  at home.  Head is normocephalic, atraumatic.  No labored breathing.  Speech is clear and coherent with logical content.  Patient is alert and oriented at  baseline.    Assessment and Plan: 1. Bacterial vaginitis  - metroNIDAZOLE (FLAGYL) 500 MG tablet; Take 1 tablet (500 mg total) by mouth 2 (two) times daily for 7 days.  Dispense: 14 tablet; Refill: 0     Follow Up Instructions: I discussed the assessment and treatment plan with the patient. The patient was provided an opportunity to ask questions and all were answered. The patient agreed with the plan and demonstrated an understanding of the instructions.  A copy of instructions were sent to the patient via MyChart unless otherwise noted below.    The patient was advised to call back or seek an in-person evaluation if the symptoms worsen or if the condition fails to improve as anticipated.  Time:  I spent 10 minutes with the patient via telehealth technology discussing the above problems/concerns.    Apolonio Schneiders, FNP

## 2022-10-08 ENCOUNTER — Telehealth: Payer: Medicaid Other | Admitting: Physician Assistant

## 2022-10-08 DIAGNOSIS — N390 Urinary tract infection, site not specified: Secondary | ICD-10-CM

## 2022-10-08 MED ORDER — NITROFURANTOIN MONOHYD MACRO 100 MG PO CAPS: 100.0000 mg | ORAL_CAPSULE | Freq: Two times a day (BID) | ORAL | 0 refills | Status: AC

## 2022-10-08 NOTE — Patient Instructions (Signed)
  Andrea Espinoza, thank you for joining Lenise Arena Ward, PA-C for today's virtual visit.  While this provider is not your primary care provider (PCP), if your PCP is located in our provider database this encounter information will be shared with them immediately following your visit.   [place] account gives you access to today's visit and all your visits, tests, and labs performed at [facility] " click here if you don't have a [facility] account or go to [email or web address]  Consent: (Patient) Andrea Espinoza provided verbal consent for this virtual visit at the beginning of the encounter.  Current Medications:  Current Outpatient Medications:    nitrofurantoin, macrocrystal-monohydrate, (MACROBID) 100 MG capsule, Take 1 capsule (100 mg total) by mouth 2 (two) times daily for 5 days., Disp: 10 capsule, Rfl: 0   fluconazole (DIFLUCAN) 200 MG tablet, Take 1 tablet (200 mg total) by mouth every 3 (three) days as needed., Disp: 2 tablet, Rfl: 0   neomycin-polymyxin-hydrocortisone (CORTISPORIN) OTIC solution, Place 3 drops into the left ear 4 (four) times daily. X 7 days, Disp: 10 mL, Rfl: 0   terconazole (TERAZOL 7) 0.4 % vaginal cream, Apply about 1 inch to external tissues daily at bedtime for up to 7 days, Disp: 45 g, Rfl: 0   Medications ordered in this encounter:  Meds ordered this encounter  Medications   nitrofurantoin, macrocrystal-monohydrate, (MACROBID) 100 MG capsule    Sig: Take 1 capsule (100 mg total) by mouth 2 (two) times daily for 5 days.    Dispense:  10 capsule    Refill:  0    Order Specific Question:   Supervising Provider    Answer:   Chase Picket [id number]     *If you need refills on other medications prior to your next appointment, please contact your pharmacy*  Follow-Up: Call back or seek an in-person evaluation if the symptoms worsen or if the condition fails to improve as anticipated.  [name] [phone]  Other Instructions Take antibiotic as prescribed. Drink plenty of fluids.  If no improvement follow up for in person evaluation with your PCP or Urgent Care.    If you have been instructed to have an in-person evaluation today at a local Urgent Care facility, please use the link below. It will take you to a list of all of our available [facility], including address, phone number and hours of operation. Please do not delay care.  [facility]  If you or a family member do not have a primary care provider, use the link below to schedule a visit and establish care. When you choose a [facility] primary care physician or advanced practice provider, you gain a long-term partner in health. Find a Primary Care Provider  Learn more about [facility]'s in-office and virtual care options: [facility]

## 2022-10-08 NOTE — Progress Notes (Signed)
Virtual Visit Consent   Andrea Espinoza, you are scheduled for a virtual visit with a [facility] provider today. Just as with appointments in the office, your consent must be obtained to participate. Your consent will be active for this visit and any virtual visit you may have with one of our providers in the next 365 days. If you have a MyChart account, a copy of this consent can be sent to you electronically.  As this is a virtual visit, video technology does not allow for your provider to perform a traditional examination. This may limit your provider's ability to fully assess your condition. If your provider identifies any concerns that need to be evaluated in person or the need to arrange testing (such as labs, EKG, etc.), we will make arrangements to do so. Although advances in technology are sophisticated, we cannot ensure that it will always work on either your end or our end. If the connection with a video visit is poor, the visit may have to be switched to a telephone visit. With either a video or telephone visit, we are not always able to ensure that we have a secure connection.  By engaging in this virtual visit, you consent to the provision of healthcare and authorize for your insurance to be billed (if applicable) for the services provided during this visit. Depending on your insurance coverage, you may receive a charge related to this service.  I need to obtain your verbal consent now. Are you willing to proceed with your visit today? Andrea Espinoza has provided verbal consent on 10/08/2022 for a virtual visit (video or telephone). Andrea Arena Ward, PA-C  Date: 10/08/2022 5:54 PM  Virtual Visit via Video Note   I, Andrea Espinoza, connected with  Andrea Espinoza  ([id number], 01-May-1996) on 10/08/22 at  5:45 PM EST by a video-enabled telemedicine application and verified that I am speaking with the correct person using two identifiers.  Location: Patient: Virtual Visit Location Patient:  Home Provider: Virtual Visit Location Provider: Home Office   I discussed the limitations of evaluation and management by telemedicine and the availability of in person appointments. The patient expressed understanding and agreed to proceed.    History of Present Illness: Andrea Espinoza is a 27 y.o. who identifies as a female who was assigned female at birth, and is being seen today for dysuria that started three days ago.  Denies vaginal discharge, vaginal itching, n/v/d, fever, hematuria, flank pain, abdominal pain.  She reports increased urinary frequency and an odor.  She reports feeling like she can't empty her bladder all the way.   HPI: HPI  Problems:  Patient Active Problem List   Diagnosis Date Noted   Postpartum care and examination 03/25/2019   Indication for care in labor or delivery 02/24/2019   Normal labor 02/24/2019   Vaginal delivery 02/24/2019   Postpartum hemorrhage 02/24/2019   Pyelectasis of fetus on prenatal ultrasound 12/09/2018   Supervision of high risk pregnancy, antepartum 08/18/2018   History of pre-eclampsia 08/18/2018   History of HELLP syndrome, currently pregnant 08/18/2018    Allergies: No Known Allergies Medications:  Current Outpatient Medications:    nitrofurantoin, macrocrystal-monohydrate, (MACROBID) 100 MG capsule, Take 1 capsule (100 mg total) by mouth 2 (two) times daily for 5 days., Disp: 10 capsule, Rfl: 0   fluconazole (DIFLUCAN) 200 MG tablet, Take 1 tablet (200 mg total) by mouth every 3 (three) days as needed., Disp: 2 tablet, Rfl: 0   neomycin-polymyxin-hydrocortisone (CORTISPORIN) OTIC solution, Place  3 drops into the left ear 4 (four) times daily. X 7 days, Disp: 10 mL, Rfl: 0   terconazole (TERAZOL 7) 0.4 % vaginal cream, Apply about 1 inch to external tissues daily at bedtime for up to 7 days, Disp: 45 g, Rfl: 0  Observations/Objective: Patient is well-developed, well-nourished in no acute distress.  Resting comfortably at home.  Head  is normocephalic, atraumatic.  No labored breathing.  Speech is clear and coherent with logical content.  Patient is alert and oriented at baseline.    Assessment and Plan: 1. Urinary tract infection without hematuria, site unspecified - nitrofurantoin, macrocrystal-monohydrate, (MACROBID) 100 MG capsule; Take 1 capsule (100 mg total) by mouth 2 (two) times daily for 5 days.  Dispense: 10 capsule; Refill: 0  Advised in person evaluation if no improvement   Follow Up Instructions: I discussed the assessment and treatment plan with the patient. The patient was provided an opportunity to ask questions and all were answered. The patient agreed with the plan and demonstrated an understanding of the instructions.  A copy of instructions were sent to the patient via MyChart unless otherwise noted below.     The patient was advised to call back or seek an in-person evaluation if the symptoms worsen or if the condition fails to improve as anticipated.  Time:  I spent 13 minutes with the patient via telehealth technology discussing the above problems/concerns.    Andrea Arena Ward, PA-C

## 2023-02-13 ENCOUNTER — Telehealth: Payer: Medicaid Other | Admitting: Physician Assistant

## 2023-02-13 DIAGNOSIS — N760 Acute vaginitis: Secondary | ICD-10-CM | POA: Diagnosis not present

## 2023-02-13 DIAGNOSIS — B9689 Other specified bacterial agents as the cause of diseases classified elsewhere: Secondary | ICD-10-CM

## 2023-02-13 MED ORDER — METRONIDAZOLE 0.75 % VA GEL: 1.0000 | Freq: Two times a day (BID) | VAGINAL | 0 refills | Status: DC

## 2023-02-13 NOTE — Progress Notes (Signed)
Virtual Visit Consent   Andrea Espinoza, you are scheduled for a virtual visit with a [place] provider today. Just as with appointments in the office, your consent must be obtained to participate. Your consent will be active for this visit and any virtual visit you may have with one of our providers in the next 365 days. If you have a MyChart account, a copy of this consent can be sent to you electronically.  As this is a virtual visit, video technology does not allow for your provider to perform a traditional examination. This may limit your provider's ability to fully assess your condition. If your provider identifies any concerns that need to be evaluated in person or the need to arrange testing (such as labs, EKG, etc.), we will make arrangements to do so. Although advances in technology are sophisticated, we cannot ensure that it will always work on either your end or our end. If the connection with a video visit is poor, the visit may have to be switched to a telephone visit. With either a video or telephone visit, we are not always able to ensure that we have a secure connection.  By engaging in this virtual visit, you consent to the provision of healthcare and authorize for your insurance to be billed (if applicable) for the services provided during this visit. Depending on your insurance coverage, you may receive a charge related to this service.  I need to obtain your verbal consent now. Are you willing to proceed with your visit today? Andrea Espinoza has provided verbal consent on 02/13/2023 for a virtual visit (video or telephone). Piedad Climes, [place]  Date: 02/13/2023 12:30 PM  Virtual Visit via Video Note   I, Piedad Climes, connected with  Andrea Espinoza  ([id number], July 12, 1996) on 02/13/23 at 12:15 PM EDT by a video-enabled telemedicine application and verified that I am speaking with the correct person using two identifiers.  Location: Patient: Virtual Visit Location  Patient: Home Provider: Virtual Visit Location Provider: Home Office   I discussed the limitations of evaluation and management by telemedicine and the availability of in person appointments. The patient expressed understanding and agreed to proceed.    History of Present Illness: Andrea Espinoza is a 27 y.o. who identifies as a female who was assigned female at birth, and is being seen today for possible bacterial vaginosis. Notes this has been a more frequent issue for her over the past year and unsure why. Notes now with several days of vaginal discharge with strong odor and mild vaginal irritation. Denies fever, chills, pevic pain. Is sexually active with one female partner without concern for STI or pregnancy.    HPI: HPI  Problems:  Patient Active Problem List   Diagnosis Date Noted   Postpartum care and examination 03/25/2019   Indication for care in labor or delivery 02/24/2019   Normal labor 02/24/2019   Vaginal delivery 02/24/2019   Postpartum hemorrhage 02/24/2019   Pyelectasis of fetus on prenatal ultrasound 12/09/2018   Supervision of high risk pregnancy, antepartum 08/18/2018   History of pre-eclampsia 08/18/2018   History of HELLP syndrome, currently pregnant 08/18/2018    Allergies: No Known Allergies Medications:  Current Outpatient Medications:    metroNIDAZOLE (METROGEL) 0.75 % vaginal gel, Place 1 Applicatorful vaginally 2 (two) times daily., Disp: 70 g, Rfl: 0  Observations/Objective: Patient is well-developed, well-nourished in no acute distress.  Resting comfortably  at home.  Head is normocephalic, atraumatic.  No labored breathing.  Speech is  clear and coherent with logical content.  Patient is alert and oriented at baseline.   Assessment and Plan: 1. Bacterial vaginosis - metroNIDAZOLE (METROGEL) 0.75 % vaginal gel; Place 1 Applicatorful vaginally 2 (two) times daily.  Dispense: 70 g; Refill: 0  Supportive measures and recommended change in hygiene products  used reviewed. Metrogel per orders. Follow-up in person if continuing to be a frequent issue.   Follow Up Instructions: I discussed the assessment and treatment plan with the patient. The patient was provided an opportunity to ask questions and all were answered. The patient agreed with the plan and demonstrated an understanding of the instructions.  A copy of instructions were sent to the patient via MyChart unless otherwise noted below.   The patient was advised to call back or seek an in-person evaluation if the symptoms worsen or if the condition fails to improve as anticipated.  Time:  I spent 10 minutes with the patient via telehealth technology discussing the above problems/concerns.    Piedad Climes, PA-C

## 2023-02-13 NOTE — Patient Instructions (Signed)
[name], thank you for joining Piedad Climes, PA-C for today's virtual visit.  While this provider is not your primary care provider (PCP), if your PCP is located in our provider database this encounter information will be shared with them immediately following your visit.   A [facility] MyChart account gives you access to today's visit and all your visits, tests, and labs performed at [facility] " click here if you don't have a [facility] MyChart account or go to [email or web address]  Consent: (Patient) Andrea Espinoza provided verbal consent for this virtual visit at the beginning of the encounter.  Current Medications:  Current Outpatient Medications:    fluconazole (DIFLUCAN) 200 MG tablet, Take 1 tablet (200 mg total) by mouth every 3 (three) days as needed., Disp: 2 tablet, Rfl: 0   neomycin-polymyxin-hydrocortisone (CORTISPORIN) OTIC solution, Place 3 drops into the left ear 4 (four) times daily. X 7 days, Disp: 10 mL, Rfl: 0   terconazole (TERAZOL 7) 0.4 % vaginal cream, Apply about 1 inch to external tissues daily at bedtime for up to 7 days, Disp: 45 g, Rfl: 0   Medications ordered in this encounter:  No orders of the defined types were placed in this encounter.    *If you need refills on other medications prior to your next appointment, please contact your pharmacy*  Follow-Up: Call back or seek an in-person evaluation if the symptoms worsen or if the condition fails to improve as anticipated.  [facility] [phone]  Other Instructions Please use the Metrogel as directed. Switch to an unscented and non-dyed soap -- like Dove -- for cleaning the area. You only need a small amount of soap and lots of clean water.  Watch for any pattern between symptoms and use of feminine products (tampons, pads, etc) or any lubricants used for intercourse as these can contribute.   Bacterial Vaginosis  Bacterial vaginosis is an infection of the  vagina. It happens when too many normal germs (healthy bacteria) grow in the vagina. This infection can make it easier to get other infections from sex (STIs). It is very important for pregnant women to get treated. This infection can cause babies to be born early or at a low birth weight. What are the causes? This infection is caused by an increase in certain germs that grow in the vagina. You cannot get this infection from toilet seats, bedsheets, swimming pools, or things that touch your vagina. What increases the risk? Having sex with a new person or more than one person. Having sex without protection. Douching. Having an intrauterine device (IUD). Smoking. Using drugs or drinking alcohol. These can lead you to do things that are risky. Taking certain antibiotic medicines. Being pregnant. What are the signs or symptoms? Some women have no symptoms. Symptoms may include: A discharge from your vagina. It may be gray or white. It can be watery or foamy. A fishy smell. This can happen after sex or during your menstrual period. Itching in and around your vagina. A feeling of burning or pain when you pee (urinate). How is this treated? This infection is treated with antibiotic medicines. These may be given to you as: A pill. A cream for your vagina. A medicine that you put into your vagina (suppository). If the infection comes back after treatment, you may need more antibiotics. Follow these instructions at home: Medicines Take over-the-counter and prescription medicines as told by your doctor. Take or use your antibiotic medicine as told by  your doctor. Do not stop taking or using it, even if you start to feel better. General instructions If the person you have sex with is a woman, tell her that you have this infection. She will need to follow up with her doctor. If you have a female partner, he does not need to be treated. Do not have sex until you finish treatment. Drink enough fluid  to keep your pee pale yellow. Keep your vagina and butt clean. Wash the area with warm water each day. Wipe from front to back after you use the toilet. If you are breastfeeding a baby, ask your doctor if you should keep doing so during treatment. Keep all follow-up visits. How is this prevented? Self-care Do not douche. Use only warm water to wash around your vagina. Wear underwear that is cotton or lined with cotton. Do not wear tight pants and pantyhose, especially in the summer. Safe sex Use protection when you have sex. This includes: Use condoms. Use dental dams. This is a thin layer that protects the mouth during oral sex. Limit how many people you have sex with. To prevent this infection, it is best to have sex with just one person. Get tested for STIs. The person you have sex with should also get tested. Drugs and alcohol Do not smoke or use any products that contain nicotine or tobacco. If you need help quitting, ask your doctor. Do not use drugs. Do not drink alcohol if: Your doctor tells you not to drink. You are pregnant, may be pregnant, or are planning to become pregnant. If you drink alcohol: Limit how much you have to 0-1 drink a day. Know how much alcohol is in your drink. In the [place] one drink equals one 12 oz bottle of beer (355 mL), one 5 oz glass of wine (148 mL), or one 1 oz glass of hard liquor (44 mL). Where to find more information Centers for Disease Control and Prevention: FootballExhibition.com.br American Sexual Health Association: www.ashastd.org Office on [employer] Health: http://hoffman.com/ Contact a doctor if: Your symptoms do not get better, even after you are treated. You have more discharge or pain when you pee. You have a fever or chills. You have pain in your belly (abdomen) or in the area between your hips. You have pain with sex. You bleed from your vagina between menstrual periods. Summary This infection can happen when too many germs (bacteria)  grow in the vagina. This infection can make it easier to get infections from sex (STIs). Treating this can lower that chance. Get treated if you are pregnant. This infection can cause babies to be born early. Do not stop taking or using your antibiotic medicine, even if you start to feel better. This information is not intended to replace advice given to you by your health care provider. Make sure you discuss any questions you have with your health care provider. Document Revised: 02/03/2020 Document Reviewed: 02/03/2020 Elsevier Patient Education  2024 Elsevier Inc.    If you have been instructed to have an in-person evaluation today at a local Urgent Care facility, please use the link below. It will take you to a list of all of our available [facility], including address, phone number and hours of operation. Please do not delay care.  [facility]  If you or a family member do not have a primary care provider, use the link below to schedule a visit and establish care. When you choose a [facility]  primary care physician or advanced practice provider, you gain a long-term partner in health. Find a Primary Care Provider  Learn more about [facility]'s in-office and virtual care options: [facility]

## 2023-04-15 ENCOUNTER — Telehealth: Payer: Medicaid Other | Admitting: Family Medicine

## 2023-04-15 DIAGNOSIS — N760 Acute vaginitis: Secondary | ICD-10-CM | POA: Diagnosis not present

## 2023-04-15 DIAGNOSIS — B9689 Other specified bacterial agents as the cause of diseases classified elsewhere: Secondary | ICD-10-CM | POA: Diagnosis not present

## 2023-04-15 MED ORDER — METRONIDAZOLE 500 MG PO TABS: 500.0000 mg | ORAL_TABLET | Freq: Two times a day (BID) | ORAL | 0 refills | Status: AC

## 2023-04-15 NOTE — Patient Instructions (Signed)
[name], thank you for joining Freddy Finner, NP for today's virtual visit.  While this provider is not your primary care provider (PCP), if your PCP is located in our provider database this encounter information will be shared with them immediately following your visit.   A [facility] MyChart account gives you access to today's visit and all your visits, tests, and labs performed at [facility] " click here if you don't have a [facility] MyChart account or go to [email or web address]  Consent: (Patient) Andrea Espinoza provided verbal consent for this virtual visit at the beginning of the encounter.  Current Medications:  Current Outpatient Medications:    metroNIDAZOLE (FLAGYL) 500 MG tablet, Take 1 tablet (500 mg total) by mouth 2 (two) times daily for 7 days., Disp: 14 tablet, Rfl: 0   Medications ordered in this encounter:  Meds ordered this encounter  Medications   metroNIDAZOLE (FLAGYL) 500 MG tablet    Sig: Take 1 tablet (500 mg total) by mouth 2 (two) times daily for 7 days.    Dispense:  14 tablet    Refill:  0    Order Specific Question:   Supervising Provider    Answer:   Merrilee Jansky [id number]     *If you need refills on other medications prior to your next appointment, please contact your pharmacy*  Follow-Up: Call back or seek an in-person evaluation if the symptoms worsen or if the condition fails to improve as anticipated.  [facility] [phone]  Other Instructions Boric Acid Vaginal Suppositories What is this medication? BORIC ACID (BOHR ik AS id) may support vaginal health. It may relieve the symptoms of a yeast infection, such as itching, burning, and odor. This medicine may be used for other purposes; ask your health care provider or pharmacist if you have questions. COMMON BRAND NAME(S): AZO Boric Acid with Aloe Vera, Hylafem What should I tell my care team before I take this medication? They need to know if you  have any of these conditions: Diabetes Frequent infections HIV or AIDS Immune system problems An unusual or allergic reaction to boric acid, other medications, foods, dyes, or preservatives Pregnant or trying to get pregnant Breast-feeding How should I use this medication? This medication is for use in the vagina. Do not take by mouth. Follow the directions on the prescription label. Read package directions carefully before using. Wash hands before and after use. Use this medication at bedtime, unless otherwise directed by your care team. Do not use your medication more often than directed. Do not stop using this medication except on your care team's advice. Talk to your care team about the use of this medication in children. This medication is not approved for use in children. Overdosage: If you think you have taken too much of this medicine contact a poison control center or emergency room at once. NOTE: This medicine is only for you. Do not share this medicine with others. What if I miss a dose? If you miss a dose, use it as soon as you can. If it is almost time for your next dose, use only that dose. Do not use double or extra doses. What may interact with this medication? Interactions are not expected. Do not use any other vaginal products without telling your care team. This list may not describe all possible interactions. Give your health care provider a list of all the medicines, herbs, non-prescription drugs, or dietary supplements you use. Also tell them if  you smoke, drink alcohol, or use illegal drugs. Some items may interact with your medicine. What should I watch for while using this medication? Tell your care team if your symptoms do not start to get better within a few days. It is better not to have sex until you have finished your treatment. This medication may cause condoms, diaphragms, and spermicides to not work as well. Do not rely on any of these methods to prevent sexually  transmitted infections (STIs) or pregnancy while you are using this medication. Vaginal medications may come out of the vagina during treatment. To keep the medication from getting on your clothing, wear a panty liner. The use of tampons is not recommended. To help clear up the infection, wear freshly washed cotton, not synthetic, underwear. What side effects may I notice from receiving this medication? Side effects that you should report to your care team as soon as possible: Allergic reactions--skin rash, itching, hives, swelling of the face, lips, tongue, or throat Unusual vaginal discharge, itching, or odor Side effects that usually do not require medical attention (report to your care team if they continue or are bothersome): Vaginal irritation at the application site This list may not describe all possible side effects. Call your doctor for medical advice about side effects. You may report side effects to FDA at 1-800-FDA-1088. Where should I keep my medication? Keep out of the reach of children and pets. Store in a cool, dry place between 15 and 30 degrees C (59 and 86 degrees F). Keep away from sunlight. Throw away any unused medication after the expiration date. NOTE: This sheet is a summary. It may not cover all possible information. If you have questions about this medicine, talk to your doctor, pharmacist, or health care provider.  2024 Elsevier/Gold Standard (2021-07-23 00:00:00)    If you have been instructed to have an in-person evaluation today at a local Urgent Care facility, please use the link below. It will take you to a list of all of our available [facility], including address, phone number and hours of operation. Please do not delay care.  [facility]  If you or a family member do not have a primary care provider, use the link below to schedule a visit and establish care. When you choose a [facility] primary care physician or advanced practice  provider, you gain a long-term partner in health. Find a Primary Care Provider  Learn more about [facility]'s in-office and virtual care options:  - [facility] Now a

## 2023-04-15 NOTE — Progress Notes (Signed)
Virtual Visit Consent   Andrea Espinoza, you are scheduled for a virtual visit with a [facility] provider today. Just as with appointments in the office, your consent must be obtained to participate. Your consent will be active for this visit and any virtual visit you may have with one of our providers in the next 365 days. If you have a MyChart account, a copy of this consent can be sent to you electronically.  As this is a virtual visit, video technology does not allow for your provider to perform a traditional examination. This may limit your provider's ability to fully assess your condition. If your provider identifies any concerns that need to be evaluated in person or the need to arrange testing (such as labs, EKG, etc.), we will make arrangements to do so. Although advances in technology are sophisticated, we cannot ensure that it will always work on either your end or our end. If the connection with a video visit is poor, the visit may have to be switched to a telephone visit. With either a video or telephone visit, we are not always able to ensure that we have a secure connection.  By engaging in this virtual visit, you consent to the provision of healthcare and authorize for your insurance to be billed (if applicable) for the services provided during this visit. Depending on your insurance coverage, you may receive a charge related to this service.  I need to obtain your verbal consent now. Are you willing to proceed with your visit today? Andrea Espinoza has provided verbal consent on 04/15/2023 for a virtual visit (video or telephone). Andrea Finner, NP  Date: 04/15/2023 3:10 PM  Virtual Visit via Video Note   I, Andrea Espinoza, connected with  Andrea Espinoza  ([id number], 02-25-1996) on 04/15/23 at  3:15 PM EDT by a video-enabled telemedicine application and verified that I am speaking with the correct person using two identifiers.  Location: Patient: Virtual Visit Location Patient:  Home Provider: Virtual Visit Location Provider: Home Office   I discussed the limitations of evaluation and management by telemedicine and the availability of in person appointments. The patient expressed understanding and agreed to proceed.    History of Present Illness: Andrea Espinoza is a 27 y.o. who identifies as a female who was assigned female at birth, and is being seen today for vaginal odor.    HPI: Vaginal Discharge The patient's primary symptoms include a genital odor and vaginal discharge. The patient's pertinent negatives include no genital itching, genital lesions, genital rash, missed menses, pelvic pain or vaginal bleeding. This is a chronic problem. The current episode started in the past 7 days. The problem occurs constantly. The problem has been rapidly improving. She is not pregnant. Pertinent negatives include no abdominal pain, anorexia, back pain, chills, constipation, diarrhea, discolored urine, dysuria, fever, flank pain, frequency, headaches, hematuria, joint pain, joint swelling, nausea, painful intercourse, rash, sore throat, urgency or vomiting. There has been no bleeding. She has not been passing clots. She has not been passing tissue. Nothing aggravates the symptoms. She is sexually active. No, her partner does not have an STD. She uses an IUD for contraception.    Problems:  Patient Active Problem List   Diagnosis Date Noted   Postpartum care and examination 03/25/2019   Indication for care in labor or delivery 02/24/2019   Normal labor 02/24/2019   Vaginal delivery 02/24/2019   Postpartum hemorrhage 02/24/2019   Pyelectasis of fetus on prenatal ultrasound 12/09/2018  Supervision of high risk pregnancy, antepartum 08/18/2018   History of pre-eclampsia 08/18/2018   History of HELLP syndrome, currently pregnant 08/18/2018    Allergies: No Known Allergies Medications:  Current Outpatient Medications:    metroNIDAZOLE (METROGEL) 0.75 % vaginal gel, Place 1  Applicatorful vaginally 2 (two) times daily., Disp: 70 g, Rfl: 0  Observations/Objective: Patient is well-developed, well-nourished in no acute distress.  Resting comfortably  at home.  Head is normocephalic, atraumatic.  No labored breathing.  Speech is clear and coherent with logical content.  Patient is alert and oriented at baseline.    Assessment and Plan:  1. BV (bacterial vaginosis)  - metroNIDAZOLE (FLAGYL) 500 MG tablet; Take 1 tablet (500 mg total) by mouth 2 (two) times daily for 7 days.  Dispense: 14 tablet; Refill: 0  Discussed boric acid supp uses and follow up testing if not improving   Healthy vaginal hygiene practices   -  Avoid sleeper pajamas. Nightgowns allow air to circulate.  Sleep without underpants whenever possible.  -  Wear cotton underpants during the day. Double-rinse underwear after washing to avoid residual irritants. Do not use fabric softeners for underwear and swimsuits.  - Avoid tights, leotards, leggings, "skinny" jeans, and other tight-fitting clothing. Skirts and loose-fitting pants allow air to circulate.  - Avoid pantyliners.  Instead use tampons or cotton pads.  - Use the restroom after intercourse to help prevent UTI's  - Daily warm bathing is helpful:     - Soak in clean water (no soap) for 10 to 15 minutes. Adding vinegar or baking soda to the water has not been specifically studied and may not be better than clean water alone.      - Use soap to wash regions other than the genital area just before getting out of the tub. Limit use of any soap on genital areas. Use fragance-free soaps.     - Rinse the genital area well and gently pat dry.  Don't rub.  Hair dryer to assist with drying can be used only if on cool setting.     - Do not use bubble baths or perfumed soaps.  - Do not use any feminine sprays, douches or powders.  These contain chemicals that will irritate the skin.  - If the genital area is tender or swollen, cool compresses  may relieve the discomfort. Unscented wet wipes can be used instead of toilet paper for wiping.   - Emollients, such as Vaseline, may help protect skin and can be applied to the irritated area.  - Always remember to wipe front-to-back after bowel movements. Pat dry after urination.  - Do not sit in wet swimsuits for long periods of time after swimming   Reviewed side effects, risks and benefits of medication.    Patient acknowledged agreement and understanding of the plan.   Past Medical, Surgical, Social History, Allergies, and Medications have been Reviewed.     Follow Up Instructions: I discussed the assessment and treatment plan with the patient. The patient was provided an opportunity to ask questions and all were answered. The patient agreed with the plan and demonstrated an understanding of the instructions.  A copy of instructions were sent to the patient via MyChart unless otherwise noted below.     The patient was advised to call back or seek an in-person evaluation if the symptoms worsen or if the condition fails to improve as anticipated.  Time:  I spent 10 minutes with the patient via telehealth technology discussing  the above problems/concerns.    Andrea Finner, NP

## 2023-04-23 IMAGING — US US OB < 14 WEEKS - US OB TV
1 series · 15 of 28 positions shown · non-contrast
Comparison: None.

CLINICAL DATA: Heavy vaginal bleeding. Misoprostol taken
06/29/2021.

EXAM:
OBSTETRIC <14 WK US AND TRANSVAGINAL OB US
TECHNIQUE: Both transabdominal and transvaginal ultrasound examinations were
performed for complete evaluation of the gestation as well as the
maternal uterus, adnexal regions, and pelvic cul-de-sac.
Transvaginal technique was performed to assess early pregnancy.

[Series 1: us ob < 14 weeks - us ob tv · 77 acquisitions, 15 frames shown]
[im 1/77]
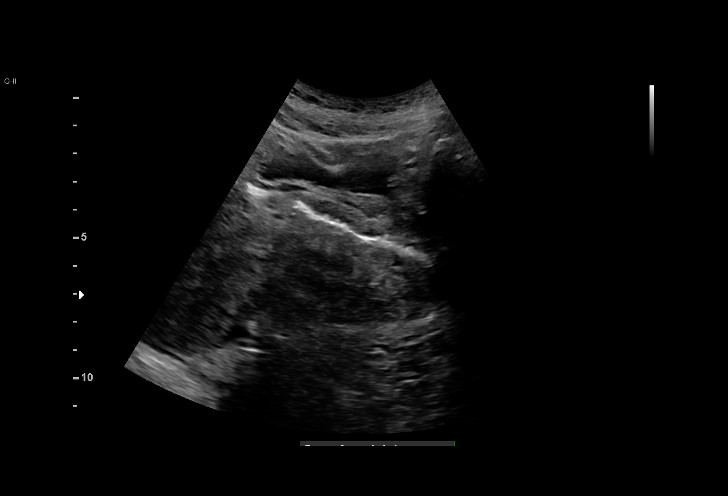
[im 6/77]
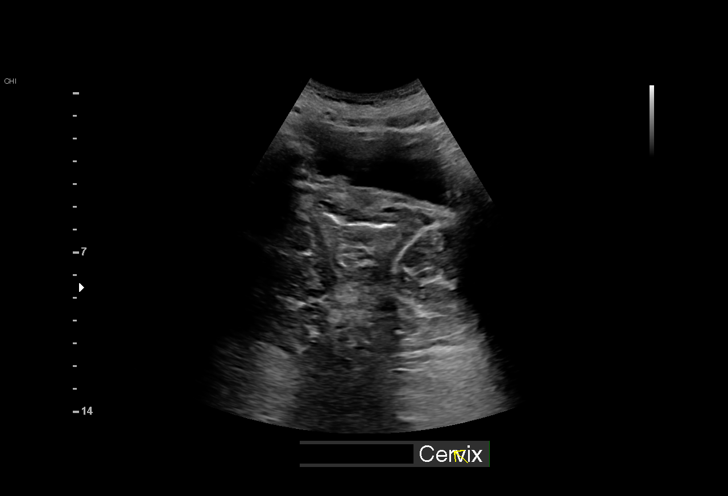
[im 12/77]
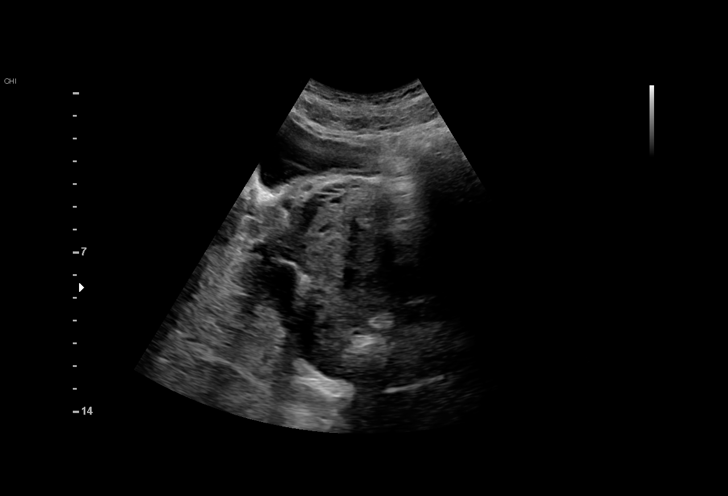
[im 17/77]
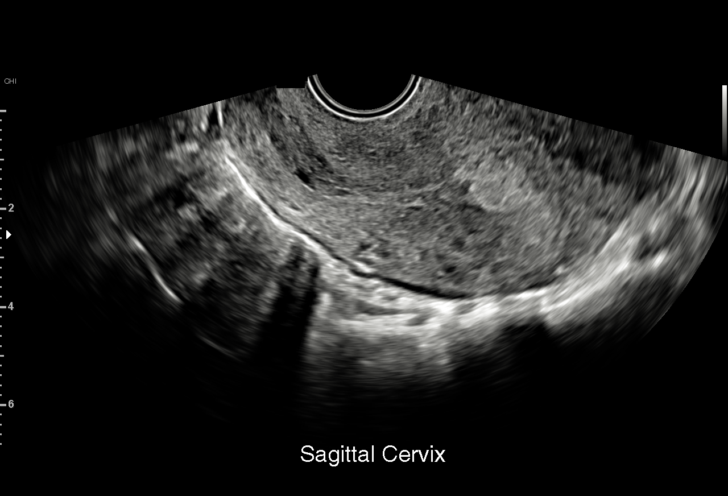
[im 23/77]
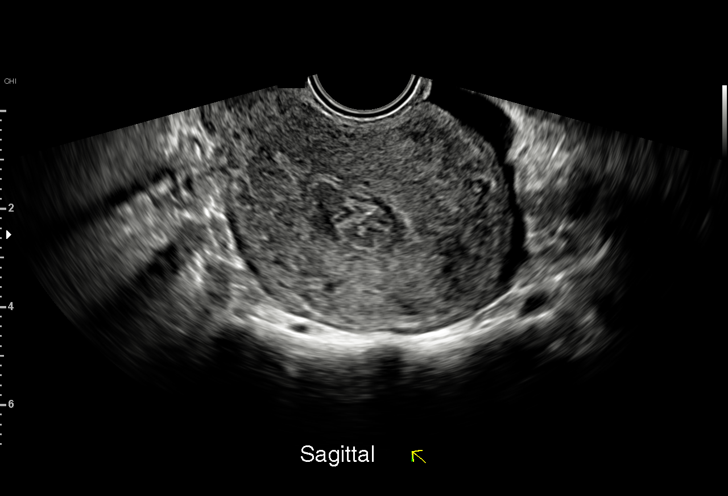
[im 29/77]
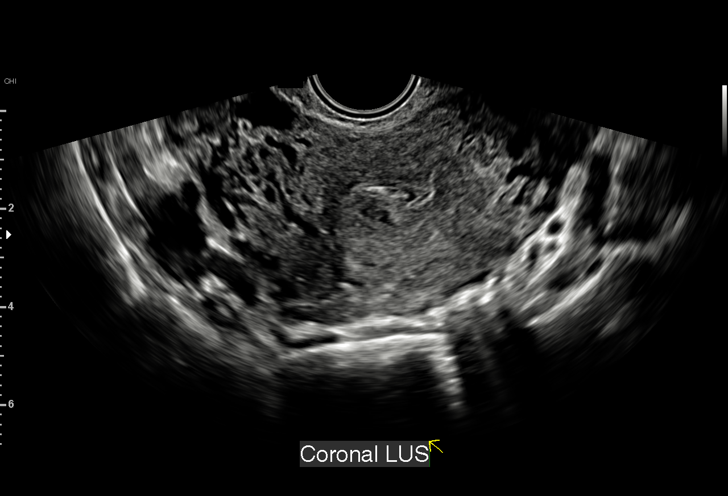
[im 34/77]
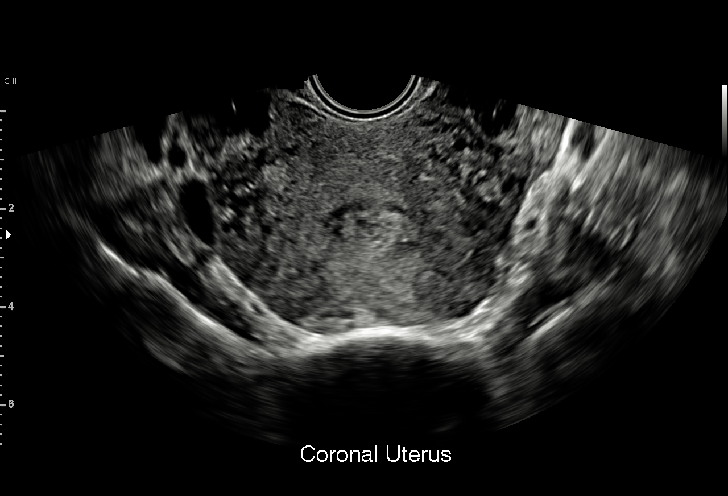
[im 40/77]
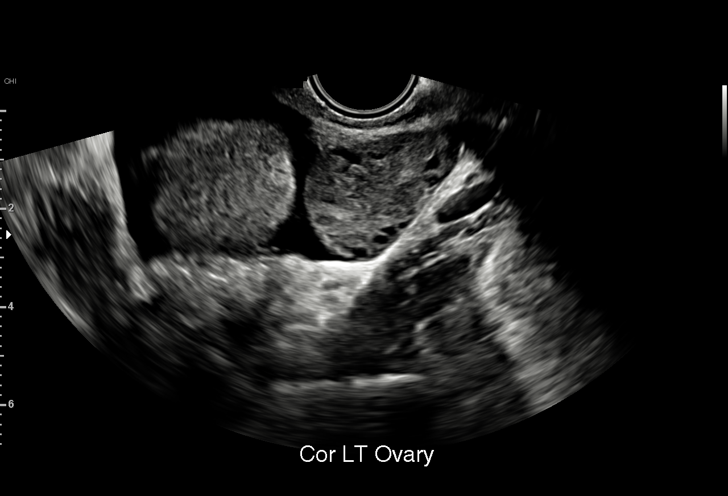
[im 43/77]
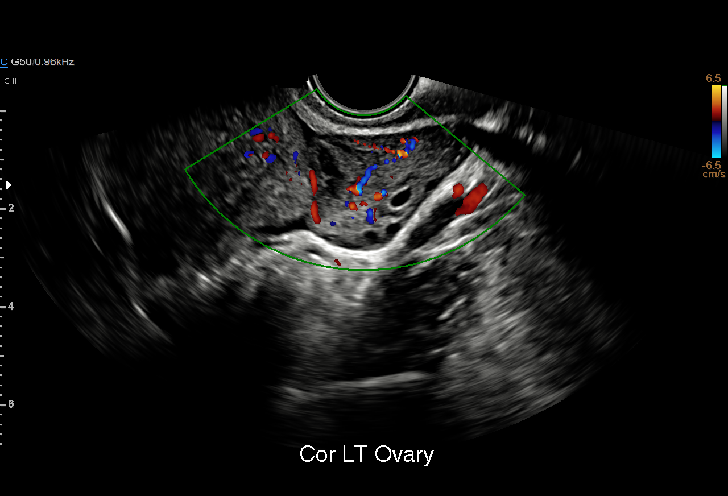
[im 48/77]
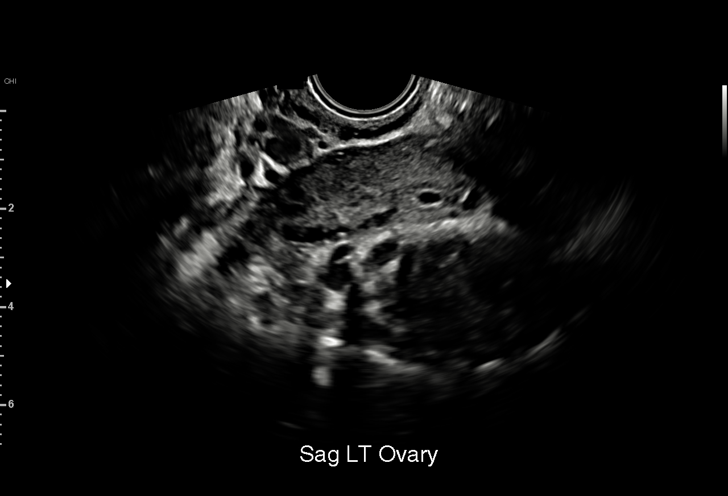
[im 54/77]
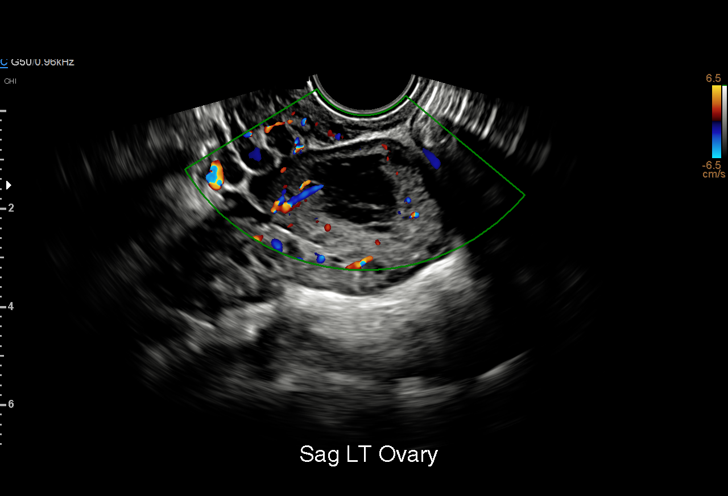
[im 60/77]
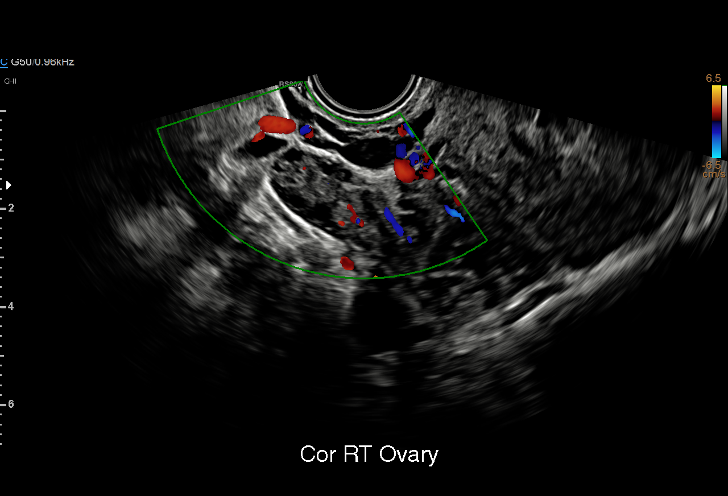
[im 65/77]
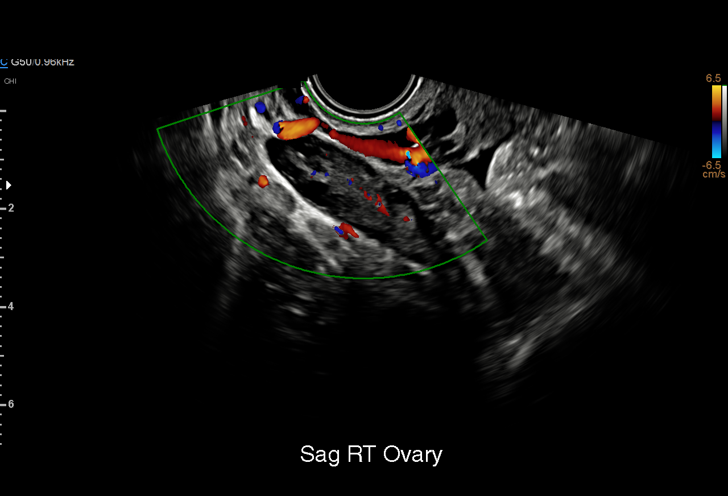
[im 71/77]
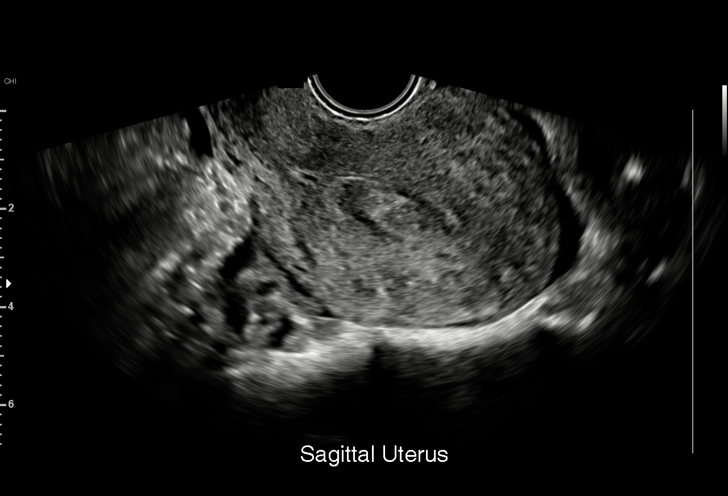
[im 77/77]
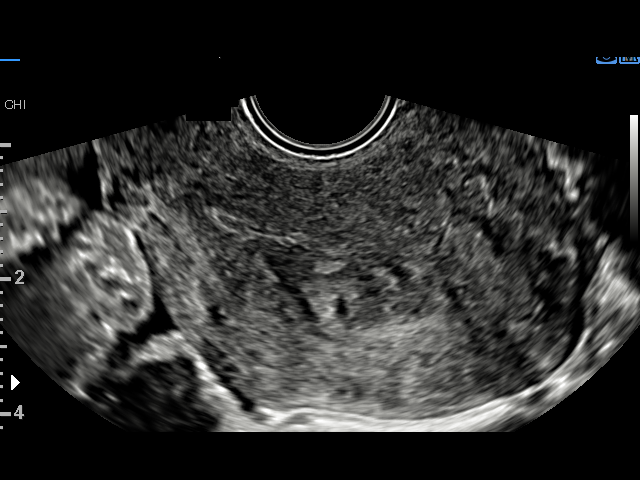

[15 of 28 positions shown; findings below may reference images not displayed]

FINDINGS: Intrauterine gestational sac: None

Yolk sac:  Not Visualized.

Embryo:  Not Visualized.

Cardiac Activity: Not Visualized.

Heart Rate:   bpm

MSD:   mm    w     d

CRL:    mm    w    d                  US EDC:

Subchorionic hemorrhage:  None visualized.

Maternal uterus/adnexae: Area of focal thickening and heterogeneity
within the endometrium in the fundus. Increased blood flow noted.
Findings concerning for retained products of conception. No adnexal
mass. Small amount of free fluid in the pelvis.
IMPRESSION: No intrauterine gestation. Focally thickened, heterogeneous
endometrium in the fundus concerning for retained products of
conception.

## 2023-10-18 ENCOUNTER — Telehealth: Admitting: Family Medicine

## 2023-10-18 DIAGNOSIS — N760 Acute vaginitis: Secondary | ICD-10-CM

## 2023-10-18 DIAGNOSIS — H6992 Unspecified Eustachian tube disorder, left ear: Secondary | ICD-10-CM

## 2023-10-18 DIAGNOSIS — B9689 Other specified bacterial agents as the cause of diseases classified elsewhere: Secondary | ICD-10-CM

## 2023-10-18 MED ORDER — PREDNISONE 20 MG PO TABS: 20.0000 mg | ORAL_TABLET | Freq: Two times a day (BID) | ORAL | 0 refills | Status: AC

## 2023-10-18 MED ORDER — METRONIDAZOLE 0.75 % VA GEL: 1.0000 | Freq: Every day | VAGINAL | 0 refills | Status: AC

## 2023-10-18 NOTE — Progress Notes (Signed)
 Virtual Visit Consent   Andrea Espinoza, you are scheduled for a virtual visit with a [facility] provider today. Just as with appointments in the office, your consent must be obtained to participate. Your consent will be active for this visit and any virtual visit you may have with one of our providers in the next 365 days. If you have a MyChart account, a copy of this consent can be sent to you electronically.  As this is a virtual visit, video technology does not allow for your provider to perform a traditional examination. This may limit your provider's ability to fully assess your condition. If your provider identifies any concerns that need to be evaluated in person or the need to arrange testing (such as labs, EKG, etc.), we will make arrangements to do so. Although advances in technology are sophisticated, we cannot ensure that it will always work on either your end or our end. If the connection with a video visit is poor, the visit may have to be switched to a telephone visit. With either a video or telephone visit, we are not always able to ensure that we have a secure connection.  By engaging in this virtual visit, you consent to the provision of healthcare and authorize for your insurance to be billed (if applicable) for the services provided during this visit. Depending on your insurance coverage, you may receive a charge related to this service.  I need to obtain your verbal consent now. Are you willing to proceed with your visit today? Andrea Espinoza has provided verbal consent on 10/18/2023 for a virtual visit (video or telephone). Andrea Curio, FNP  Date: 10/18/2023 4:13 PM   Virtual Visit via Video Note   I, Andrea Espinoza, connected with  Andrea Espinoza  ([id number], Mar 04, 1996) on 10/18/23 at  4:15 PM EST by a video-enabled telemedicine application and verified that I am speaking with the correct person using two identifiers.  Location: Patient: Virtual Visit Location Patient: Home Provider:  Virtual Visit Location Provider: Home Office   I discussed the limitations of evaluation and management by telemedicine and the availability of in person appointments. The patient expressed understanding and agreed to proceed.    History of Present Illness: Andrea Espinoza is a 28 y.o. who identifies as a female who was assigned female at birth, and is being seen today for discharge with odor with history of BV. Also has feeling like left ear is stopped up. She had a cold last week and it feels full. No pain or fever or drainage. Marland Kitchen  HPI: HPI  Problems:  Patient Active Problem List   Diagnosis Date Noted   Postpartum care and examination 03/25/2019   Indication for care in labor or delivery 02/24/2019   Normal labor 02/24/2019   Vaginal delivery 02/24/2019   Postpartum hemorrhage 02/24/2019   Pyelectasis of fetus on prenatal ultrasound 12/09/2018   Supervision of high risk pregnancy, antepartum 08/18/2018   History of pre-eclampsia 08/18/2018   History of HELLP syndrome, currently pregnant 08/18/2018    Allergies: No Known Allergies Medications:  Current Outpatient Medications:    metroNIDAZOLE (METROGEL) 0.75 % vaginal gel, Place 1 Applicatorful vaginally at bedtime for 5 days., Disp: 50 g, Rfl: 0   predniSONE (DELTASONE) 20 MG tablet, Take 1 tablet (20 mg total) by mouth 2 (two) times daily with a meal for 5 days., Disp: 10 tablet, Rfl: 0  Observations/Objective: Patient is well-developed, well-nourished in no acute distress.  Resting comfortably  at home.  Head is  normocephalic, atraumatic.  No labored breathing.  Speech is clear and coherent with logical content.  Patient is alert and oriented at baseline.   Assessment and Plan: 1. Bacterial vaginosis (Primary)  2. Dysfunction of left eustachian tube  Urgent care if sx persist or worsen.   Follow Up Instructions: I discussed the assessment and treatment plan with the patient. The patient was provided an opportunity to ask  questions and all were answered. The patient agreed with the plan and demonstrated an understanding of the instructions.  A copy of instructions were sent to the patient via MyChart unless otherwise noted below.     The patient was advised to call back or seek an in-person evaluation if the symptoms worsen or if the condition fails to improve as anticipated.    Andrea Curio, FNP

## 2023-10-18 NOTE — Patient Instructions (Signed)
 Eustachian Tube Dysfunction  Eustachian tube dysfunction refers to a condition in which a blockage develops in the narrow passage that connects the middle ear to the back of the nose (eustachian tube). The eustachian tube regulates air pressure in the middle ear by letting air move between the ear and nose. It also helps to drain fluid from the middle ear space. Eustachian tube dysfunction can affect one or both ears. When the eustachian tube does not function properly, air pressure, fluid, or both can build up in the middle ear. What are the causes? This condition occurs when the eustachian tube becomes blocked or cannot open normally. Common causes of this condition include: Ear infections. Colds and other infections that affect the nose, mouth, and throat (upper respiratory tract). Allergies. Irritation from cigarette smoke. Irritation from stomach acid coming up into the esophagus (gastroesophageal reflux). The esophagus is the part of the body that moves food from the mouth to the stomach. Sudden changes in air pressure, such as from descending in an airplane or scuba diving. Abnormal growths in the nose or throat, such as: Growths that line the nose (nasal polyps). Abnormal growth of cells (tumors). Enlarged tissue at the back of the throat (adenoids). What increases the risk? You are more likely to develop this condition if: You smoke. You are overweight. You are a child who has: Certain birth defects of the mouth, such as cleft palate. Large tonsils or adenoids. What are the signs or symptoms? Common symptoms of this condition include: A feeling of fullness in the ear. Ear pain. Clicking or popping noises in the ear. Ringing in the ear (tinnitus). Hearing loss. Loss of balance. Dizziness. Symptoms may get worse when the air pressure around you changes, such as when you travel to an area of high elevation, fly on an airplane, or go scuba diving. How is this diagnosed? This  condition may be diagnosed based on: Your symptoms. A physical exam of your ears, nose, and throat. Tests, such as those that measure: The movement of your eardrum. Your hearing (audiometry). How is this treated? Treatment depends on the cause and severity of your condition. In mild cases, you may relieve your symptoms by moving air into your ears. This is called "popping the ears." In more severe cases, or if you have symptoms of fluid in your ears, treatment may include: Medicines to relieve congestion (decongestants). Medicines that treat allergies (antihistamines). Nasal sprays or ear drops that contain medicines that reduce swelling (steroids). A procedure to drain the fluid in your eardrum. In this procedure, a small tube may be placed in the eardrum to: Drain the fluid. Restore the air in the middle ear space. A procedure to insert a balloon device through the nose to inflate the opening of the eustachian tube (balloon dilation). Follow these instructions at home: Lifestyle Do not do any of the following until your health care provider approves: Travel to high altitudes. Fly in airplanes. Work in a Estate agent or room. Scuba dive. Do not use any products that contain nicotine or tobacco. These products include cigarettes, chewing tobacco, and vaping devices, such as e-cigarettes. If you need help quitting, ask your health care provider. Keep your ears dry. Wear fitted earplugs during showering and bathing. Dry your ears completely after. General instructions Take over-the-counter and prescription medicines only as told by your health care provider. Use techniques to help pop your ears as recommended by your health care provider. These may include: Chewing gum. Yawning. Frequent, forceful swallowing.  Closing your mouth, holding your nose closed, and gently blowing as if you are trying to blow air out of your nose. Keep all follow-up visits. This is important. Contact a  health care provider if: Your symptoms do not go away after treatment. Your symptoms come back after treatment. You are unable to pop your ears. You have: A fever. Pain in your ear. Pain in your head or neck. Fluid draining from your ear. Your hearing suddenly changes. You become very dizzy. You lose your balance. Get help right away if: You have a sudden, severe increase in any of your symptoms. Summary Eustachian tube dysfunction refers to a condition in which a blockage develops in the eustachian tube. It can be caused by ear infections, allergies, inhaled irritants, or abnormal growths in the nose or throat. Symptoms may include ear pain or fullness, hearing loss, or ringing in the ears. Mild cases are treated with techniques to unblock the ears, such as yawning or chewing gum. More severe cases are treated with medicines or procedures. This information is not intended to replace advice given to you by your health care provider. Make sure you discuss any questions you have with your health care provider. Document Revised: 10/16/2020 Document Reviewed: 10/16/2020 Elsevier Patient Education  2024 Elsevier Inc.Vaginal Infection (Bacterial Vaginosis): What to Know  Bacterial vaginosis is an infection of the vagina. It happens when the balance of normal germs (bacteria) in the vagina changes. It's common among females ages 39 to 60. If left untreated, it can increase your risk of getting a sexually transmitted infection (STI). If you're pregnant, you need to get treated right away. This infection can cause a baby to be born early or at a low birth weight. What are the causes? This happens when too many harmful germs grow in the vagina. The exact reason why this happens isn't known. You can't get this infection from toilet seats, bedding, swimming pools, or contact with objects around you. What increases the risk? Having new or multiple sexual partners, or unprotected  sex. Douching. Using an intrauterine device (IUD). Smoking. Alcohol and drug abuse. Taking certain antibiotics. Being pregnant. You can get a vaginal infection without being sexually active. However, it most often occurs in sexually active females. What are the signs or symptoms? Some females have no symptoms. If you have symptoms, they may include: Wallace Cullens or white vaginal discharge. It can be watery or foamy. A fish-like smell, especially after sex or during your menstrual period. Itching in and around the vagina. Burning or pain with peeing. How is this diagnosed? This infection is diagnosed based on: Your medical history. A physical exam of the vagina. Checking a sample of vaginal fluid for harmful bacteria or uncommon cells. How is this treated? This condition is treated with antibiotics. These may be given as: A pill. A cream for your vagina. A medicine that you put into your vagina called a suppository. If the infection comes back, you may need more antibiotics. Follow these instructions at home: Medicines Take your medicines only as told. Take or apply your antibiotics as told. Do not stop using them even if you start to feel better. General instructions If you have a female sexual partner, tell her about the infection. She should see her health care provider. Female partners don't need treatment. Avoid sex until treatment is complete. Drink more fluids as told. Keep the area around your vagina and rectum clean. Wash the area daily with warm water. Wipe yourself from front to  back after pooping. If you're breastfeeding, talk to your provider about continuing during treatment. How is this prevented? Self-care Do not douche or use vaginal deodorant sprays. Douching can upset the balance of good and harmful bacteria in the vagina, which can cause an infection to happen again. Wear cotton or cotton-lined underwear. Avoid wearing tight pants or pantyhose, especially in the  summer. Safe sex Use condoms correctly and every time you have sex. Use dental dams to protect yourself during oral sex. Limit the number of sexual partners. Get tested for STIs. Your sexual partner should also get tested. Drugs and alcohol Do not smoke, vape, or use nicotine or tobacco. Do not use drugs. Limit the amount of alcohol you drink because it can lead to risky sexual behavior. Where to find more information To learn more: Go to TonerPromos.no. Click Health Topics A-Z. Type "bacterial vaginosis" in the search box. American Sexual Health Association (ASHA): ashasexualhealth.org U.S. Department of Health and Health and safety inspector, Office on Women's Health: TravelLesson.ca Contact a health care provider if: Your symptoms don't get better, even after treatment. You have more discharge or pain when peeing. You have a fever or chills. You have pain in your belly or pelvis. You have pain during sex. You have vaginal bleeding between menstrual periods. This information is not intended to replace advice given to you by your health care provider. Make sure you discuss any questions you have with your health care provider. Document Revised: 01/22/2023 Document Reviewed: 01/22/2023 Elsevier Patient Education  2024 [employer].

## 2024-04-28 ENCOUNTER — Telehealth: Admitting: Physician Assistant

## 2024-04-28 DIAGNOSIS — B9689 Other specified bacterial agents as the cause of diseases classified elsewhere: Secondary | ICD-10-CM

## 2024-04-28 DIAGNOSIS — N760 Acute vaginitis: Secondary | ICD-10-CM

## 2024-04-28 MED ORDER — METRONIDAZOLE 0.75 % VA GEL: 1.0000 | Freq: Every day | VAGINAL | 0 refills | Status: AC

## 2024-04-28 NOTE — Patient Instructions (Signed)
 Andrea Espinoza, thank you for joining [name], PA-C for today's virtual visit.  While this provider is not your primary care provider (PCP), if your PCP is located in our provider database this encounter information will be shared with them immediately following your visit.   A [facility] MyChart account gives you access to today's visit and all your visits, tests, and labs performed at [facility]  click here if you don't have a [name] MyChart account or go to [email or web address]  Consent: (Patient) Andrea Espinoza provided verbal consent for this virtual visit at the beginning of the encounter.  Current Medications: No current outpatient medications on file.   Medications ordered in this encounter:  No orders of the defined types were placed in this encounter.    *If you need refills on other medications prior to your next appointment, please contact your pharmacy*  Follow-Up: Call back or seek an in-person evaluation if the symptoms worsen or if the condition fails to improve as anticipated.  [facility] [phone]  Other Instructions Vaginal Infection (Bacterial Vaginosis): What to Know  Bacterial vaginosis is an infection of the vagina. It happens when the balance of normal germs (bacteria) in the vagina changes. If you don't get treated, it can make it easier for you to get other infections from sex. These are called sexually transmitted infections (STIs). If you're pregnant, you need to get treated right away. This infection can cause a baby to be born early or at a low birth weight. What are the causes? This infection happens when too many harmful germs grow in the vagina. You can't get this infection from toilet seats, bedsheets, swimming pools, or things that touch your vagina. What increases the risk? Having sex with a new person or more than one person. Having sex without protection. Douching. Having an intrauterine device  (IUD). Smoking. Using drugs or drinking alcohol. These can lead you to do risky things. Taking certain antibiotics. Being pregnant. What are the signs or symptoms? Some females have no symptoms. Symptoms may include: A gray or white discharge from your vagina. It can be watery or foamy. A fishy smell. This can happen after sex or during your menstrual period. Itching in and around your vagina. Burning or pain when you pee. How is this treated? This infection is treated with antibiotics. These may be given to you as: A pill. A cream for your vagina. A medicine that you put into your vagina (suppository). If the infection comes back, you may need more antibiotics. Follow these instructions at home: Medicines Take your medicines as told. Take or use your antibiotics as told. Do not stop using them even if you start to feel better. General instructions If the person you have sex with is a female, tell her that you have this infection. She will need to follow up with her doctor. Female partners don't need to be treated. Do not have sex until you finish treatment. Drink more fluids as told. Keep your vagina and butt clean. Wash these areas with warm water each day. Wipe from front to back after you poop. If you're breastfeeding a baby, talk to your doctor if you should keep doing so during treatment. How is this prevented? Self-care Do not douche. Do not use deodorant sprays on your vagina. Wear cotton underwear. Do not wear tight pants and pantyhose, especially in the summer. Safe sex Use condoms the correct way and every time you have sex. Use dental dams  to protect yourself during oral sex. Limit how many people you have sex with. Get tested for STIs. The person you have sex with should also get tested. Drugs and alcohol Do not smoke, vape, or use nicotine or tobacco. Do not use drugs. Limit the amount of alcohol you drink because it can lead you to do risky things. Where to  find more information To learn more: Go to TonerPromos.no. Click Health Topics A-Z. Type bacterial vaginosis in the search bar. American Sexual Health Association (ASHA): ashasexualhealth.org U.S. Department of Health and [employer], Office on Women's Health: TravelLesson.ca Contact a doctor if: Your symptoms don't get better, even after treatment. You have more discharge or pain when you pee. You have a fever or chills. You have pain in your belly or in the area between your hips. You have pain during sex. You bleed from your vagina between menstrual periods. This information is not intended to replace advice given to you by your health care provider. Make sure you discuss any questions you have with your health care provider. Document Revised: 01/22/2023 Document Reviewed: 01/22/2023 Elsevier Patient Education  2024 Elsevier Inc.   If you have been instructed to have an in-person evaluation today at a local Urgent Care facility, please use the link below. It will take you to a list of all of our available [facility], including address, phone number and hours of operation. Please do not delay care.  [facility]  If you or a family member do not have a primary care provider, use the link below to schedule a visit and establish care. When you choose a [facility] primary care physician or advanced practice provider, you gain a long-term partner in health. Find a Primary Care Provider  Learn more about [facility]'s in-office and virtual care options: [facility] - Get Care Now

## 2024-04-28 NOTE — Progress Notes (Signed)
 Virtual Visit Consent   Andrea Espinoza, you are scheduled for a virtual visit with a [facility] provider today. Just as with appointments in the office, your consent must be obtained to participate. Your consent will be active for this visit and any virtual visit you may have with one of our providers in the next 365 days. If you have a MyChart account, a copy of this consent can be sent to you electronically.  As this is a virtual visit, video technology does not allow for your provider to perform a traditional examination. This may limit your provider's ability to fully assess your condition. If your provider identifies any concerns that need to be evaluated in person or the need to arrange testing (such as labs, EKG, etc.), we will make arrangements to do so. Although advances in technology are sophisticated, we cannot ensure that it will always work on either your end or our end. If the connection with a video visit is poor, the visit may have to be switched to a telephone visit. With either a video or telephone visit, we are not always able to ensure that we have a secure connection.  By engaging in this virtual visit, you consent to the provision of healthcare and authorize for your insurance to be billed (if applicable) for the services provided during this visit. Depending on your insurance coverage, you may receive a charge related to this service.  I need to obtain your verbal consent now. Are you willing to proceed with your visit today? Andrea Espinoza has provided verbal consent on 04/28/2024 for a virtual visit (video or telephone). Andrea Espinoza, [place]  Date: 04/28/2024 6:33 PM   Virtual Visit via Video Note   I, Andrea Espinoza, connected with  Andrea Espinoza  ([id number], [birth year]) on 04/28/24 at  6:15 PM EDT by a video-enabled telemedicine application and verified that I am speaking with the correct person using two identifiers.  Location: Patient: Virtual Visit Location  Patient: Home Provider: Virtual Visit Location Provider: Home Office   I discussed the limitations of evaluation and management by telemedicine and the availability of in person appointments. The patient expressed understanding and agreed to proceed.    History of Present Illness: Andrea Espinoza is a 28 y.o. who identifies as a female who was assigned female at birth, and is being seen today for concern of bacterial vaginosis. Endorses symptoms starting a few days ago with substantial vaginal odor with thin discharge.  Notes history of BV, with last treatment  about 4 months ago. Notes she uses a peppermint soap for sensitive areas. Denies any other change to soaps, lotions, detergents, etc. Is sexually active with one female partner (not new) and denies concern for pregnancy or STI.  HPI: HPI  Problems:  Patient Active Problem List   Diagnosis Date Noted   Postpartum care and examination 03/25/2019   Indication for care in labor or delivery 02/24/2019   Normal labor 02/24/2019   Vaginal delivery 02/24/2019   Postpartum hemorrhage 02/24/2019   Pyelectasis of fetus on prenatal ultrasound 12/09/2018   Supervision of high risk pregnancy, antepartum 08/18/2018   History of pre-eclampsia 08/18/2018   History of HELLP syndrome, currently pregnant 08/18/2018    Allergies: No Known Allergies Medications:  Current Outpatient Medications:    metroNIDAZOLE  (METROGEL ) 0.75 % vaginal gel, Place 1 Applicatorful vaginally at bedtime for 7 days., Disp: 70 g, Rfl: 0  Observations/Objective: Patient is well-developed, well-nourished in no acute distress.  Resting comfortably  at  home.  Head is normocephalic, atraumatic.  No labored breathing.  Speech is clear and coherent with logical content.  Patient is alert and oriented at baseline.    Assessment and Plan: 1. Bacterial vaginosis (Primary) - metroNIDAZOLE  (METROGEL ) 0.75 % vaginal gel; Place 1 Applicatorful vaginally at bedtime for 7 days.   Dispense: 70 g; Refill: 0  Supportive measures and OTC medications reviewed. Metrogel  per orders. Follow-up with PCP.  Follow Up Instructions: I discussed the assessment and treatment plan with the patient. The patient was provided an opportunity to ask questions and all were answered. The patient agreed with the plan and demonstrated an understanding of the instructions.  A copy of instructions were sent to the patient via MyChart unless otherwise noted below.   The patient was advised to call back or seek an in-person evaluation if the symptoms worsen or if the condition fails to improve as anticipated.    Andrea Velma Lunger, PA-C
